# Patient Record
Sex: Female | Born: 1990 | Race: Black or African American | Hispanic: No | Marital: Single | Smoking: Never smoker
Health system: Southern US, Community
[De-identification: ages and names within clinical notes are randomized; demographics above are authoritative.]

## PROBLEM LIST (undated history)

## (undated) ENCOUNTER — Inpatient Hospital Stay (HOSPITAL_COMMUNITY): Payer: Self-pay

## (undated) DIAGNOSIS — K8020 Calculus of gallbladder without cholecystitis without obstruction: Secondary | ICD-10-CM

## (undated) DIAGNOSIS — K219 Gastro-esophageal reflux disease without esophagitis: Secondary | ICD-10-CM

## (undated) HISTORY — PX: NO PAST SURGERIES: SHX2092

---

## 2012-08-19 NOTE — L&D Delivery Note (Signed)
Delivery Note I was called at 2037 and informed she was completely dilated and +2 station, but very numb from her epidural.  I was told she would try pushing and I was to be called if she started to make progress.  The next call I received was at 2125 asking how far away I was and that I should come for delivery.  I immediately left my house and came directly to the hospital.  When I walked in the room the baby was on mom's abdomen with the cord clamped, foley catheter still in place.  I delivered the placenta and inspected the perineum.  At 9:37 PM a viable female was delivered via Vaginal, Spontaneous Delivery.  APGAR: 8, 9; weight pending.   Placenta status: Intact, Spontaneous.  Cord: 3 vessels with the following complications: None.  Anesthesia: Epidural  Episiotomy: None Lacerations: None Suture Repair: None Est. Blood Loss (mL): 400  Mom to postpartum.  Baby to Couplet care / Skin to Skin, stable.  Danel Requena D 07/04/2013, 9:50 PM

## 2013-01-01 LAB — OB RESULTS CONSOLE ANTIBODY SCREEN: Antibody Screen: NEGATIVE

## 2013-01-01 LAB — OB RESULTS CONSOLE RUBELLA ANTIBODY, IGM: Rubella: IMMUNE

## 2013-01-01 LAB — OB RESULTS CONSOLE GC/CHLAMYDIA
Chlamydia: NEGATIVE
Gonorrhea: NEGATIVE

## 2013-01-01 LAB — OB RESULTS CONSOLE ABO/RH

## 2013-01-01 LAB — OB RESULTS CONSOLE HEPATITIS B SURFACE ANTIGEN: Hepatitis B Surface Ag: NEGATIVE

## 2013-03-26 ENCOUNTER — Emergency Department (HOSPITAL_COMMUNITY)
Admission: EM | Admit: 2013-03-26 | Discharge: 2013-03-27 | Disposition: A | Discharge status: Home / Self Care | Payer: Medicaid Other | Attending: Emergency Medicine | Admitting: Emergency Medicine

## 2013-03-26 ENCOUNTER — Emergency Department (HOSPITAL_COMMUNITY): Payer: Medicaid Other

## 2013-03-26 ENCOUNTER — Other Ambulatory Visit: Payer: Self-pay

## 2013-03-26 ENCOUNTER — Encounter (HOSPITAL_COMMUNITY): Payer: Self-pay

## 2013-03-26 DIAGNOSIS — K8050 Calculus of bile duct without cholangitis or cholecystitis without obstruction: Secondary | ICD-10-CM

## 2013-03-26 DIAGNOSIS — O219 Vomiting of pregnancy, unspecified: Secondary | ICD-10-CM | POA: Insufficient documentation

## 2013-03-26 DIAGNOSIS — R112 Nausea with vomiting, unspecified: Secondary | ICD-10-CM

## 2013-03-26 DIAGNOSIS — Z3490 Encounter for supervision of normal pregnancy, unspecified, unspecified trimester: Secondary | ICD-10-CM

## 2013-03-26 DIAGNOSIS — K8020 Calculus of gallbladder without cholecystitis without obstruction: Secondary | ICD-10-CM | POA: Insufficient documentation

## 2013-03-26 DIAGNOSIS — R61 Generalized hyperhidrosis: Secondary | ICD-10-CM | POA: Insufficient documentation

## 2013-03-26 DIAGNOSIS — O9989 Other specified diseases and conditions complicating pregnancy, childbirth and the puerperium: Secondary | ICD-10-CM | POA: Insufficient documentation

## 2013-03-26 DIAGNOSIS — R109 Unspecified abdominal pain: Secondary | ICD-10-CM | POA: Insufficient documentation

## 2013-03-26 LAB — COMPREHENSIVE METABOLIC PANEL
ALT: 25 U/L (ref 0–35)
CO2: 20 mEq/L (ref 19–32)
Calcium: 9.4 mg/dL (ref 8.4–10.5)
Chloride: 101 mEq/L (ref 96–112)
Creatinine, Ser: 0.69 mg/dL (ref 0.50–1.10)
GFR calc Af Amer: >90 mL/min (ref >90)
GFR calc non Af Amer: >90 mL/min (ref >90)
Glucose, Bld: 121 mg/dL — ABNORMAL HIGH (ref 70–99)
Total Bilirubin: 0.2 mg/dL — ABNORMAL LOW (ref 0.3–1.2)

## 2013-03-26 LAB — CBC WITH DIFFERENTIAL/PLATELET
Eosinophils Relative: 1 % (ref 0–5)
HCT: 38.6 % (ref 36.0–46.0)
Hemoglobin: 13.6 g/dL (ref 12.0–15.0)
Lymphocytes Relative: 34 % (ref 12–46)
Lymphs Abs: 4.7 10*3/uL — ABNORMAL HIGH (ref 0.7–4.0)
MCV: 92.8 fL (ref 78.0–100.0)
Monocytes Absolute: 1.3 10*3/uL — ABNORMAL HIGH (ref 0.1–1.0)
Neutro Abs: 7.7 10*3/uL (ref 1.7–7.7)
RBC: 4.16 MIL/uL (ref 3.87–5.11)
WBC: 13.7 10*3/uL — ABNORMAL HIGH (ref 4.0–10.5)

## 2013-03-26 LAB — GLUCOSE, CAPILLARY: Glucose-Capillary: 113 mg/dL — ABNORMAL HIGH (ref 70–99)

## 2013-03-26 MED ORDER — ACETAMINOPHEN 325 MG PO TABS: 650.0000 mg | ORAL_TABLET | Freq: Once | ORAL | Status: DC

## 2013-03-26 MED ORDER — ONDANSETRON HCL 4 MG/2ML IJ SOLN
4.0000 mg | Freq: Once | INTRAMUSCULAR | Status: AC
  Administered 2013-03-26: 4 mg via INTRAVENOUS
  Filled 2013-03-26: qty 2

## 2013-03-26 NOTE — ED Notes (Signed)
Pt c/o epigastric pain radiating into her chest, mid to lower back, nausea, vomiting, diaphoresis, and weakness starting approx 30 mins ago. Pt is [redacted] weeks pregnant, pt's OBGYN sent her to the ED for further evaluation of possible gall stones

## 2013-03-26 NOTE — ED Provider Notes (Signed)
CSN: [id number]     Arrival date & time 03/26/13  2208 History     First MD Initiated Contact with Patient 03/26/13 2245     Chief Complaint  Patient presents with  . Chest Pain  . Abdominal Pain   HPI Morgan Walsh is a 22 y.o. female presenting at 21 weeks pregnancy, she's G1 P0, please refer to the ED by her obstetrician. Earlier this evening after eating a bag of chips, she had acute onset of sharp epigastric/left upper quadrant pain, she said it felt "like gas" was not associated with chest pressure, shortness of breath, hemoptysis, fever, chills, diarrhea. She said it did radiate to the back was also associated with some weakness. It was associated with nausea and vomiting.  Also associated with some diaphoresis. Symptoms have completely resolved, she no longer has pain, nausea, vomiting. Mother had a history of gallstones.   History reviewed. No pertinent past medical history. History reviewed. No pertinent past surgical history. History reviewed. No pertinent family history. History  Substance Use Topics  . Smoking status: Never Smoker   . Smokeless tobacco: Not on file  . Alcohol Use: No   OB History   Grav Para Term Preterm Abortions TAB SAB Ect Mult Living   1              Review of Systems At least 10pt or greater review of systems completed and are negative except where specified in the HPI.  Allergies  Review of patient's allergies indicates no known allergies.  Home Medications   Current Outpatient Rx  Name  Route  Sig  Dispense  Refill  . Pediatric Multivit-Minerals-C (FLINTSTONES GUMMIES PO)   Oral   Take 2 tablets by mouth daily.          BP 118/62  Pulse 102  Resp 24  SpO2 100%  LMP 09/26/2012 Physical Exam  Nursing notes reviewed.  Electronic medical record reviewed. VITAL SIGNS:   Filed Vitals:   03/26/13 2220  BP: 118/62  Pulse: 102  Resp: 24  SpO2: 100%   CONSTITUTIONAL: Awake, oriented, appears non-toxic HENT: Atraumatic,  normocephalic, oral mucosa pink and moist, airway patent. Nares patent without drainage. External ears normal. EYES: Conjunctiva clear, EOMI, PERRLA NECK: Trachea midline, non-tender, supple CARDIOVASCULAR: Normal heart rate, Normal rhythm, No murmurs, rubs, gallops PULMONARY/CHEST: Clear to auscultation, no rhonchi, wheezes, or rales. Symmetrical breath sounds. Non-tender. ABDOMINAL: Non-distended, obese, gravid, soft, non-tender - no rebound or guarding.  BS normal. NEUROLOGIC: Non-focal, moving all four extremities, no gross sensory or motor deficits. EXTREMITIES: No clubbing, cyanosis, or edema SKIN: Warm, Dry, No erythema, No rash  ED Course   Procedures (including critical care time)  Labs Reviewed  CBC WITH DIFFERENTIAL - Abnormal; Notable for the following:    WBC 13.7 (*)    Lymphs Abs 4.7 (*)    Monocytes Absolute 1.3 (*)    All other components within normal limits  COMPREHENSIVE METABOLIC PANEL - Abnormal; Notable for the following:    Glucose, Bld 121 (*)    Albumin 3.2 (*)    Total Bilirubin 0.2 (*)    All other components within normal limits  GLUCOSE, CAPILLARY - Abnormal; Notable for the following:    Glucose-Capillary 113 (*)    All other components within normal limits  LIPASE, BLOOD  POCT I-STAT TROPONIN I   Dg Chest 1 View  03/27/2013   *RADIOLOGY REPORT*  Clinical Data: Epigastric pain radiating to the back.  6 months pregnant.  CHEST - 1 VIEW  Comparison: None.  Findings: Single AP view was obtained.  The patient was shielded. The heart size and mediastinal contours are normal.  Mild vascular congestion is attributed to gravid state.  There is no edema, confluent airspace opacity or significant pleural effusion.  IMPRESSION: No active cardiopulmonary process.   Original Report Authenticated By: Carey Bullocks, M.D.   US Abdomen Complete  03/27/2013   *RADIOLOGY REPORT*  Clinical Data:  Upper abdominal pain.  ABDOMINAL ULTRASOUND COMPLETE  Comparison:  None   Findings:  Gallbladder:  Small stones are seen partially filling the gallbladder, with associated echogenic sludge.  No gallbladder wall thickening or pericholecystic fluid is seen to suggest cholecystitis.  No ultrasonographic Murphy's sign is elicited.  Common Bile Duct:  0.5 cm in diameter; within normal limits in caliber.  Liver:  Normal parenchymal echogenicity and echotexture; no focal lesions identified.  Limited Doppler evaluation demonstrates normal blood flow within the liver.  IVC:  Unremarkable in appearance.  Pancreas:  Although the pancreas is difficult to visualize in its entirety due to overlying bowel gas, no focal pancreatic abnormality is identified.  Spleen:  9.3 cm in length; within normal limits in size and echotexture.  Right kidney:  14.2 cm in length; enlarged, though normal in configuration and parenchymal echogenicity.  No evidence of mass or hydronephrosis.  Left kidney:  11.6 cm in length; normal in size, configuration and parenchymal echogenicity.  No evidence of mass or hydronephrosis.  Abdominal Aorta:  Normal in caliber; no aneurysm identified.  IMPRESSION:  1.  Cholelithiasis noted, with associated echogenic sludge.  No evidence for obstruction or cholecystitis; gallbladder otherwise unremarkable in appearance. 2.  Right-sided nephromegaly incidentally noted; kidneys otherwise unremarkable in appearance.   Original Report Authenticated By: Tonia Ghent, M.D.   1. Cholelithiases   2. Biliary colic   3. Nausea and vomiting   4. Pregnancy    Medications  ondansetron (ZOFRAN) injection 4 mg (4 mg Intravenous Given 03/26/13 2254)     MDM  At bedside ultrasound shows patient has gallstones, fetus heart rate in the 140s - good movement. Bilateral does not extend, do not see any pericholecystic fluid, did not get a good look at the CBD, we'll send patient for a formal study.  Also of patient's pain radiating to her back, chest x-ray however shows no increase in the mediastinal  width. A low suspicion for dissection in this patient.  Ultrasound of the abdomen shows cholelithiasis with echogenic sludge without evidence for obstruction or cholecystitis. This is consistent with labs,  there is no biliary obstruction evidence with LFTs.   Lipase is unremarkable. White blood cell count is slightly elevated at 13.7 this is likely secondary to vomiting, it is not that is not reflective of an acute intra-abdominal process.   The patient followup with her OB/GYN, discussed a bland diet with the patient eating smaller more frequent meals avoiding fatty foods. Patient was sent home with Zofran for nausea.   Jones Skene, MD 03/27/13 0700

## 2013-03-27 ENCOUNTER — Encounter (HOSPITAL_COMMUNITY): Payer: Self-pay

## 2013-03-27 ENCOUNTER — Inpatient Hospital Stay (HOSPITAL_COMMUNITY)
Admission: AD | Admit: 2013-03-27 | Discharge: 2013-03-27 | Disposition: A | Discharge status: Home / Self Care | Payer: Medicaid Other | Source: Ambulatory Visit | Attending: Obstetrics and Gynecology | Admitting: Obstetrics and Gynecology

## 2013-03-27 DIAGNOSIS — R1011 Right upper quadrant pain: Secondary | ICD-10-CM | POA: Insufficient documentation

## 2013-03-27 DIAGNOSIS — K8020 Calculus of gallbladder without cholecystitis without obstruction: Secondary | ICD-10-CM

## 2013-03-27 DIAGNOSIS — O9989 Other specified diseases and conditions complicating pregnancy, childbirth and the puerperium: Secondary | ICD-10-CM | POA: Insufficient documentation

## 2013-03-27 LAB — URINE MICROSCOPIC-ADD ON

## 2013-03-27 LAB — URINALYSIS, ROUTINE W REFLEX MICROSCOPIC
Glucose, UA: 100 mg/dL — AB
Ketones, ur: NEGATIVE mg/dL
Nitrite: NEGATIVE
Specific Gravity, Urine: 1.02 (ref 1.005–1.030)
pH: 7.5 (ref 5.0–8.0)

## 2013-03-27 MED ORDER — ONDANSETRON HCL 4 MG PO TABS: 4.0000 mg | ORAL_TABLET | Freq: Four times a day (QID) | ORAL | Status: DC

## 2013-03-27 MED ORDER — OXYCODONE-ACETAMINOPHEN 5-325 MG PO TABS: 1.0000 | ORAL_TABLET | ORAL | Status: DC | PRN

## 2013-03-27 NOTE — MAU Provider Note (Signed)
History     CSN: [id number]  Arrival date and time: 03/27/13 4098  Seen by provider at 2015    Chief Complaint  Patient presents with  . Cholelithiasis  . Abdominal Pain   HPI Morgan Walsh 21 y.o. [redacted]w[redacted]d Was at [facility] ER yesterday and was diagnosed with gallstones in pregnancy.  Today began having right upper abdominal pain and left flank pain so she returned to MAU.  SHe had a severe episode of pain with nausea and diaphoresis which lasted about 15 minutes.  By the time she came to MAU and was assisted to a stretcher, the pain was resolving.  Wants some pain medication to have at home.  OB History   Grav Para Term Preterm Abortions TAB SAB Ect Mult Living   1               Past Medical History  Diagnosis Date  . Medical history non-contributory     Past Surgical History  Procedure Laterality Date  . No past surgeries      History reviewed. No pertinent family history.  History  Substance Use Topics  . Smoking status: Never Smoker   . Smokeless tobacco: Never Used  . Alcohol Use: No    Allergies: No Known Allergies  Prescriptions prior to admission  Medication Sig Dispense Refill  . ondansetron (ZOFRAN) 4 MG tablet Take 1 tablet (4 mg total) by mouth every 6 (six) hours.  12 tablet  0  . Pediatric Multivit-Minerals-C (FLINTSTONES GUMMIES PO) Take 2 tablets by mouth daily.        Review of Systems  Constitutional: Negative for fever.  Gastrointestinal: Positive for nausea and abdominal pain. Negative for vomiting, diarrhea and constipation.  Genitourinary:       No vaginal discharge. No vaginal bleeding. No dysuria.   Physical Exam   Blood pressure 111/57, pulse 71, temperature 98.3 F (36.8 C), temperature source Axillary, resp. rate 16, height 5\' 7"  (1.702 m), weight 230 lb (104.327 kg), last menstrual period 09/26/2012, SpO2 100.00%.  Physical Exam  Nursing note and vitals reviewed. Constitutional: She is oriented to person, place, and time. She  appears well-developed and well-nourished. No distress.  HENT:  Head: Normocephalic.  Eyes: EOM are normal.  Neck: Neck supple.  Musculoskeletal: Normal range of motion.  Neurological: She is alert and oriented to person, place, and time.  Skin: Skin is warm and dry.  Psychiatric: She has a normal mood and affect.    MAU Course  Procedures Results for orders placed during the hospital encounter of 03/27/13 (from the past 24 hour(s))  URINALYSIS, ROUTINE W REFLEX MICROSCOPIC     Status: Abnormal   Collection Time    03/27/13  7:56 PM      Result Value Range   Color, Urine YELLOW  YELLOW   APPearance HAZY (*) CLEAR   Specific Gravity, Urine 1.020  1.005 - 1.030   pH 7.5  5.0 - 8.0   Glucose, UA 100 (*) NEGATIVE mg/dL   Hgb urine dipstick NEGATIVE  NEGATIVE   Bilirubin Urine NEGATIVE  NEGATIVE   Ketones, ur NEGATIVE  NEGATIVE mg/dL   Protein, ur NEGATIVE  NEGATIVE mg/dL   Urobilinogen, UA 1.0  0.0 - 1.0 mg/dL   Nitrite NEGATIVE  NEGATIVE   Leukocytes, UA TRACE (*) NEGATIVE  URINE MICROSCOPIC-ADD ON     Status: Abnormal   Collection Time    03/27/13  7:56 PM      Result Value Range  Squamous Epithelial / LPF FEW (*) RARE   WBC, UA 0-2  <3 WBC/hpf   Bacteria, UA RARE  RARE   Urine-Other MUCOUS PRESENT      MDM Consult with Dr. Senaida Ores re: plan of care  Assessment and Plan  Gall bladder attack Pregnancy [redacted]w[redacted]d  Plan Rx percocet one po q 6 hours for pain. (#8) no refills Follow up in the office this week Reviewed dietary choices to reduce occurrence of pain. Reduce intake of sugar and carbohydrates.  [name] 03/27/2013, 7:54 PM

## 2013-03-27 NOTE — MAU Note (Signed)
Pt states was seen at [facility], dx'd with gallstones, given iv fluids w phenergan, not given meds for pain, was told to come to MAU if pain returned. Pt states when pain began, became nauseated and clammy.

## 2013-04-01 ENCOUNTER — Encounter (INDEPENDENT_AMBULATORY_CARE_PROVIDER_SITE_OTHER): Payer: Self-pay | Admitting: Surgery

## 2013-04-07 ENCOUNTER — Ambulatory Visit (INDEPENDENT_AMBULATORY_CARE_PROVIDER_SITE_OTHER): Payer: Medicaid Other | Admitting: General Surgery

## 2013-04-12 ENCOUNTER — Ambulatory Visit (INDEPENDENT_AMBULATORY_CARE_PROVIDER_SITE_OTHER): Payer: Medicaid Other | Admitting: Surgery

## 2013-04-12 ENCOUNTER — Encounter (INDEPENDENT_AMBULATORY_CARE_PROVIDER_SITE_OTHER): Payer: Self-pay | Admitting: Surgery

## 2013-04-12 VITALS — BP 118/68 | HR 64 | Temp 97.9°F | Resp 14 | Ht 67.0 in | Wt 226.0 lb

## 2013-04-12 DIAGNOSIS — K8020 Calculus of gallbladder without cholecystitis without obstruction: Secondary | ICD-10-CM

## 2013-04-12 NOTE — Progress Notes (Signed)
Patient ID: Morgan Walsh, female   DOB: 08/21/1990, 22 y.o.   MRN: [id number]  Chief Complaint  Patient presents with  . New Evaluation    eval gb    HPI Morgan Walsh is a 22 y.o. female.   HPI This is a very pleasant female referred by Dr. Ambrose Mantle for evaluation of symptomatic cholelithiasis. She is [redacted] weeks pregnant. She had one episode of right upper quadrant abdominal pain and nausea and vomiting. She had to present to [facility]. She quickly improved and is now symptom free. This had occurred after a fatty meal. The pain was moderate in intensity and did not refer any where else. Again, she has had no further attacks of discomfort. Past Medical History  Diagnosis Date  . Medical history non-contributory     Past Surgical History  Procedure Laterality Date  . No past surgeries      History reviewed. No pertinent family history.  Social History History  Substance Use Topics  . Smoking status: Never Smoker   . Smokeless tobacco: Never Used  . Alcohol Use: No    No Known Allergies  No current outpatient prescriptions on file.   No current facility-administered medications for this visit.    Review of Systems Review of Systems  Constitutional: Negative for fever, chills and unexpected weight change.  HENT: Negative for hearing loss, congestion, sore throat, trouble swallowing and voice change.   Eyes: Negative for visual disturbance.  Respiratory: Negative for cough and wheezing.   Cardiovascular: Negative for chest pain, palpitations and leg swelling.  Gastrointestinal: Positive for nausea and abdominal pain. Negative for vomiting, diarrhea, constipation, blood in stool, abdominal distention and anal bleeding.  Genitourinary: Negative for hematuria, vaginal bleeding and difficulty urinating.  Musculoskeletal: Negative for arthralgias.  Skin: Negative for rash and wound.  Neurological: Negative for seizures, syncope and headaches.  Hematological: Negative  for adenopathy. Does not bruise/bleed easily.  Psychiatric/Behavioral: Negative for confusion.    Blood pressure 118/68, pulse 64, temperature 97.9 F (36.6 C), temperature source Temporal, resp. rate 14, height 5\' 7"  (1.702 m), weight 226 lb (102.513 kg), last menstrual period 09/26/2012.  Physical Exam Physical Exam  Constitutional: She is oriented to person, place, and time. She appears well-developed and well-nourished. No distress.  HENT:  Head: Normocephalic and atraumatic.  Right Ear: External ear normal.  Left Ear: External ear normal.  Nose: Nose normal.  Mouth/Throat: No oropharyngeal exudate.  Eyes: Conjunctivae are normal. Pupils are equal, round, and reactive to light. Right eye exhibits no discharge. Left eye exhibits no discharge. No scleral icterus.  Neck: Normal range of motion. Neck supple. No tracheal deviation present.  Cardiovascular: Normal rate, regular rhythm, normal heart sounds and intact distal pulses.   No murmur heard. Pulmonary/Chest: Effort normal and breath sounds normal. No respiratory distress. She has no wheezes.  Abdominal: Soft. Bowel sounds are normal.  Gravid abdomen  There is no tenderness or guarding in the right upper quadrant or anywhere in the rest of the abdomen  Musculoskeletal: Normal range of motion. She exhibits no edema and no tenderness.  Lymphadenopathy:    She has no cervical adenopathy.  Neurological: She is alert and oriented to person, place, and time. No cranial nerve deficit.  Skin: Skin is warm and dry. No rash noted. No erythema.  Psychiatric: Her behavior is normal. Judgment normal.    Data Reviewed I have reviewed her ultrasound showing cholelithiasis. There is no gallbladder wall thickening in the bile duct is  normal. Liver function tests are normal  Assessment    Symptomatic cholelithiasis in pregnancy     Plan    Because she is now symptom free, I would like to hold a laparoscopic cholecystectomy control after  her delivery. She is currently in the second trimester. I explained gallbladder disease to her in detail and gave her literature regarding the surgery. I discussed laps have a cholecystectomy with her in detail as well. She will call me back after her delivery and less she has another attack. If she has another attack, we will consider laparoscopic cholecystectomy during the pregnancy.        Chia Rock A 04/12/2013, 3:35 PM

## 2013-05-11 ENCOUNTER — Encounter (HOSPITAL_COMMUNITY): Payer: Self-pay | Admitting: *Deleted

## 2013-05-11 ENCOUNTER — Inpatient Hospital Stay (HOSPITAL_COMMUNITY)
Admission: AD | Admit: 2013-05-11 | Discharge: 2013-05-11 | Disposition: A | Discharge status: Home / Self Care | Payer: Medicaid Other | Source: Ambulatory Visit | Attending: Obstetrics and Gynecology | Admitting: Obstetrics and Gynecology

## 2013-05-11 DIAGNOSIS — O99891 Other specified diseases and conditions complicating pregnancy: Secondary | ICD-10-CM

## 2013-05-11 DIAGNOSIS — M549 Dorsalgia, unspecified: Secondary | ICD-10-CM

## 2013-05-11 DIAGNOSIS — Y92410 Unspecified street and highway as the place of occurrence of the external cause: Secondary | ICD-10-CM | POA: Insufficient documentation

## 2013-05-11 LAB — URINALYSIS, ROUTINE W REFLEX MICROSCOPIC
Bilirubin Urine: NEGATIVE
Hgb urine dipstick: NEGATIVE
Ketones, ur: 15 mg/dL — AB
Nitrite: NEGATIVE
Protein, ur: NEGATIVE mg/dL
Specific Gravity, Urine: >1.03 — ABNORMAL HIGH (ref 1.005–1.030)
Urobilinogen, UA: 0.2 mg/dL (ref 0.0–1.0)
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
## 2013-05-12 LAB — URINE CULTURE: Colony Count: 50000

## 2013-05-25 ENCOUNTER — Inpatient Hospital Stay (HOSPITAL_COMMUNITY)
Admission: AD | Admit: 2013-05-25 | Discharge: 2013-05-25 | Disposition: A | Discharge status: Home / Self Care | Payer: Medicaid Other | Source: Ambulatory Visit | Attending: Obstetrics and Gynecology | Admitting: Obstetrics and Gynecology

## 2013-05-25 ENCOUNTER — Encounter (HOSPITAL_COMMUNITY): Payer: Self-pay | Admitting: *Deleted

## 2013-05-25 DIAGNOSIS — O99891 Other specified diseases and conditions complicating pregnancy: Secondary | ICD-10-CM | POA: Insufficient documentation

## 2013-05-25 DIAGNOSIS — R059 Cough, unspecified: Secondary | ICD-10-CM | POA: Insufficient documentation

## 2013-05-25 DIAGNOSIS — A088 Other specified intestinal infections: Secondary | ICD-10-CM | POA: Insufficient documentation

## 2013-05-25 DIAGNOSIS — A084 Viral intestinal infection, unspecified: Secondary | ICD-10-CM

## 2013-05-25 DIAGNOSIS — R05 Cough: Secondary | ICD-10-CM | POA: Insufficient documentation

## 2013-05-25 DIAGNOSIS — J029 Acute pharyngitis, unspecified: Secondary | ICD-10-CM | POA: Insufficient documentation

## 2013-05-25 LAB — URINALYSIS, ROUTINE W REFLEX MICROSCOPIC
Urobilinogen, UA: 0.2 mg/dL (ref 0.0–1.0)
pH: 6 (ref 5.0–8.0)

## 2013-05-25 LAB — URINE MICROSCOPIC-ADD ON

## 2013-05-25 MED ORDER — PROMETHAZINE HCL 12.5 MG PO TABS: 12.5000 mg | ORAL_TABLET | Freq: Four times a day (QID) | ORAL | Status: DC | PRN

## 2013-05-25 NOTE — MAU Note (Signed)
Patient is in with coughing, sore throat and nausea. Patient states that she have not vomited since Saturday. Denies abdominal pain, vaginal bleeding or lof. She reports good fetal movement. Patient states that she called her doctor;s office for anti-emetic and didn't get a call back

## 2013-05-25 NOTE — MAU Note (Signed)
Patient presents with complaint of sore throat x 1 week; cough and n/v x 4 days; states that she has not thrown up since Sunday morning (2 days ago).

## 2013-05-25 NOTE — MAU Provider Note (Signed)
Chief Complaint:  Nausea, Sore Throat and Cough   First Provider Initiated Contact with Patient 05/25/13 1335      HPI: Morgan VIOLETTE is a 22 y.o. G1P0 at [redacted]w[redacted]d who presents to maternity admissions reporting one week history of assistant nausea and occasional vomiting along with scratchy throat and ingestion with cough only in the mornings. Denies fever or chills, chest pain, shortness of breath. Minimal malaise; attending [employer]. She would like prescription for an antiemetic. Denies contractions, leakage of fluid or vaginal bleeding. Good fetal movement.   Pregnancy Course: essentially uncomplicated  Past Medical History: Past Medical History  Diagnosis Date  . Medical history non-contributory     Past obstetric history: OB History  Gravida Para Term Preterm AB SAB TAB Ectopic Multiple Living  1             # Outcome Date GA Lbr Len/2nd Weight Sex Delivery Anes PTL Lv  1 CUR               Past Surgical History: Past Surgical History  Procedure Laterality Date  . No past surgeries       Family History: Family History  Problem Relation Age of Onset  . Hypertension Mother   . Stroke Maternal Grandmother   . Diabetes Maternal Grandmother     Social History: History  Substance Use Topics  . Smoking status: Never Smoker   . Smokeless tobacco: Never Used  . Alcohol Use: No    Allergies: No Known Allergies  Meds:  Prescriptions prior to admission  Medication Sig Dispense Refill  . oxyCODONE-acetaminophen (PERCOCET/ROXICET) 5-325 MG per tablet Take 1 tablet by mouth every 4 (four) hours as needed for pain (back pain).        ROS: Pertinent findings in history of present illness.  Physical Exam  Blood pressure 129/78, pulse 81, temperature 98.5 F (36.9 C), temperature source Oral, resp. rate 18, height 5' 4.5" (1.638 m), weight 100.018 kg (220 lb 8 oz), last menstrual period 09/26/2012, SpO2 100.00%. GENERAL: Well-developed, well-nourished female in no acute  distress.  HEENT: normocephalic. Throat clear. Tonsils not enlarged, pink, no exudate. Nasal mucosa normal. No neck lymphadenopathy HEART: normal rate RESP: normal effort. Clear to auscultation bilaterally ABDOMEN: Soft, non-tender, gravid appropriate for gestational age EXTREMITIES: Nontender, no edema NEURO: alert and oriented FHT:  Baseline 135-140 , moderate variability, accelerations present, no decelerations Contractions: none   Labs: Results for orders placed during the hospital encounter of 05/25/13 (from the past 24 hour(s))  URINALYSIS, ROUTINE W REFLEX MICROSCOPIC     Status: Abnormal   Collection Time    05/25/13 12:40 PM      Result Value Range   Color, Urine YELLOW  YELLOW   APPearance CLOUDY (*) CLEAR   Specific Gravity, Urine 1.025  1.005 - 1.030   pH 6.0  5.0 - 8.0   Glucose, UA NEGATIVE  NEGATIVE mg/dL   Hgb urine dipstick NEGATIVE  NEGATIVE   Bilirubin Urine SMALL (*) NEGATIVE   Ketones, ur 15 (*) NEGATIVE mg/dL   Protein, ur NEGATIVE  NEGATIVE mg/dL   Urobilinogen, UA 0.2  0.0 - 1.0 mg/dL   Nitrite NEGATIVE  NEGATIVE   Leukocytes, UA MODERATE (*) NEGATIVE  URINE MICROSCOPIC-ADD ON     Status: Abnormal   Collection Time    05/25/13 12:40 PM      Result Value Range   Squamous Epithelial / LPF MANY (*) RARE   WBC, UA 7-10  <3 WBC/hpf  Bacteria, UA MANY (*) RARE   Urine-Other MUCOUS PRESENT      Imaging:  No results found.  MAU Course: Tolerating ginger ale  Assessment: 1. Viral gastroenteritis   G1 at 32w  Plan: Discussed with Dr. Jackelyn Knife. Discharge home BRAT diet, increase fluids, rest.     Medication List    STOP taking these medications       oxyCODONE-acetaminophen 5-325 MG per tablet  Commonly known as:  PERCOCET/ROXICET      TAKE these medications       promethazine 12.5 MG tablet  Commonly known as:  PHENERGAN  Take 1 tablet (12.5 mg total) by mouth every 6 (six) hours as needed for nausea.       Follow-up Information    Follow up In 1 week. (keep your office appointment)      Danae Orleans, CNM 05/25/2013 1:39 PM

## 2013-05-26 LAB — URINE CULTURE

## 2013-05-26 NOTE — Progress Notes (Signed)
FHT from 10-7 reviewed.  Reactive NST, no significant decels or ctx.

## 2013-05-30 ENCOUNTER — Inpatient Hospital Stay (HOSPITAL_COMMUNITY)
Admission: AD | Admit: 2013-05-30 | Discharge: 2013-05-30 | Disposition: A | Discharge status: Home / Self Care | Payer: Medicaid Other | Source: Ambulatory Visit | Attending: Obstetrics and Gynecology | Admitting: Obstetrics and Gynecology

## 2013-05-30 ENCOUNTER — Encounter (HOSPITAL_COMMUNITY): Payer: Self-pay | Admitting: *Deleted

## 2013-05-30 DIAGNOSIS — O9989 Other specified diseases and conditions complicating pregnancy, childbirth and the puerperium: Secondary | ICD-10-CM | POA: Insufficient documentation

## 2013-05-30 DIAGNOSIS — K8020 Calculus of gallbladder without cholecystitis without obstruction: Secondary | ICD-10-CM

## 2013-05-30 DIAGNOSIS — O26619 Liver and biliary tract disorders in pregnancy, unspecified trimester: Secondary | ICD-10-CM

## 2013-05-30 DIAGNOSIS — R1011 Right upper quadrant pain: Secondary | ICD-10-CM | POA: Insufficient documentation

## 2013-05-30 LAB — AMYLASE: Amylase: 179 U/L — ABNORMAL HIGH (ref 0–105)

## 2013-05-30 LAB — LIPASE, BLOOD: Lipase: 21 U/L (ref 11–59)

## 2013-05-30 LAB — COMPREHENSIVE METABOLIC PANEL
ALT: 12 U/L (ref 0–35)
Alkaline Phosphatase: 136 U/L — ABNORMAL HIGH (ref 39–117)
CO2: 19 mEq/L (ref 19–32)
Calcium: 9.1 mg/dL (ref 8.4–10.5)
Chloride: 99 mEq/L (ref 96–112)
GFR calc Af Amer: >90 mL/min (ref >90)
GFR calc non Af Amer: >90 mL/min (ref >90)
Glucose, Bld: 117 mg/dL — ABNORMAL HIGH (ref 70–99)
Sodium: 136 mEq/L (ref 135–145)
Total Bilirubin: 0.3 mg/dL (ref 0.3–1.2)

## 2013-05-30 LAB — CBC
Hemoglobin: 12.5 g/dL (ref 12.0–15.0)
MCH: 31.6 pg (ref 26.0–34.0)
RBC: 3.96 MIL/uL (ref 3.87–5.11)

## 2013-05-30 MED ORDER — HYDROMORPHONE HCL PF 1 MG/ML IJ SOLN: 1.0000 mg | Freq: Once | INTRAMUSCULAR | Status: DC

## 2013-05-30 NOTE — MAU Provider Note (Signed)
History     CSN: [id number]  Arrival date and time: 05/30/13 0102   First Provider Initiated Contact with Patient 05/30/13 0132      Chief Complaint  Patient presents with  . Abdominal Pain   HPI  Pt is a G1P0 at [redacted]w[redacted]d weeks IUP here with report of RUQ pain that started at midnight.  Pt was diagnosed with gallstones at 24 wks IUP, but treatment not given since patient was asymptomatic.  Report pain is rated 0 at this moment, however a 10/10 when it comes.  Last occurrence of pain was approximately an hour ago.  Took a percocet to help with the pain.  Also experiencing nausea and vomiting, 3-4 episodes tonight.  +diarrhea that also started yesterday.  Uncertain if had a fever at home.    Past Medical History  Diagnosis Date  . Medical history non-contributory   . Gall stone     Past Surgical History  Procedure Laterality Date  . No past surgeries      Family History  Problem Relation Age of Onset  . Hypertension Mother   . Stroke Maternal Grandmother   . Diabetes Maternal Grandmother     History  Substance Use Topics  . Smoking status: Never Smoker   . Smokeless tobacco: Never Used  . Alcohol Use: No    Allergies: No Known Allergies  Prescriptions prior to admission  Medication Sig Dispense Refill  . promethazine (PHENERGAN) 12.5 MG tablet Take 1 tablet (12.5 mg total) by mouth every 6 (six) hours as needed for nausea.  10 tablet  0    Review of Systems  Constitutional: Negative for fever and chills.  Gastrointestinal: Positive for nausea, vomiting, abdominal pain and diarrhea.  Musculoskeletal: Positive for back pain (mid).  All other systems reviewed and are negative.   Physical Exam   Blood pressure 124/67, pulse 93, resp. rate 28, last menstrual period 09/26/2012, SpO2 100.00%.  Physical Exam  Constitutional: She is oriented to person, place, and time. She appears well-developed and well-nourished. She appears distressed (appears uncomfortable).   HENT:  Head: Normocephalic.  Neck: Normal range of motion. Neck supple.  Cardiovascular: Normal rate, regular rhythm and normal heart sounds.   Respiratory: Effort normal and breath sounds normal.  GI: Soft. There is tenderness (RUQ).  Genitourinary: No bleeding around the vagina. Vaginal discharge:    Neurological: She is alert and oriented to person, place, and time.  Skin: Skin is warm. She is diaphoretic.   Dilation: Closed Effacement (%): Thick Cervical Position: Posterior Station: Ballotable Exam by:: Roney Marion, CNM  FHR 120's, +accels, reactive Toco - brief intermittent period of irregular contractions (not felt by patient) with resolution at discharge MAU Course  Procedures Results for orders placed during the hospital encounter of 05/30/13 (from the past 24 hour(s))  CBC     Status: Abnormal   Collection Time    05/30/13  1:42 AM      Result Value Range   WBC 11.5 (*) 4.0 - 10.5 K/uL   RBC 3.96  3.87 - 5.11 MIL/uL   Hemoglobin 12.5  12.0 - 15.0 g/dL   HCT 40.9  81.1 - 91.4 %   MCV 92.9  78.0 - 100.0 fL   MCH 31.6  26.0 - 34.0 pg   MCHC 34.0  30.0 - 36.0 g/dL   RDW 78.2  95.6 - 21.3 %   Platelets 317  150 - 400 K/uL  COMPREHENSIVE METABOLIC PANEL     Status: Abnormal  Collection Time    05/30/13  1:42 AM      Result Value Range   Sodium 136  135 - 145 mEq/L   Potassium 3.7  3.5 - 5.1 mEq/L   Chloride 99  96 - 112 mEq/L   CO2 19  19 - 32 mEq/L   Glucose, Bld 117 (*) 70 - 99 mg/dL   BUN 6  6 - 23 mg/dL   Creatinine, Ser 1.61  0.50 - 1.10 mg/dL   Calcium 9.1  8.4 - 09.6 mg/dL   Total Protein 6.7  6.0 - 8.3 g/dL   Albumin 2.9 (*) 3.5 - 5.2 g/dL   AST 25  0 - 37 U/L   ALT 12  0 - 35 U/L   Alkaline Phosphatase 136 (*) 39 - 117 U/L   Total Bilirubin 0.3  0.3 - 1.2 mg/dL   GFR calc non Af Amer >90  >90 mL/min   GFR calc Af Amer >90  >90 mL/min  AMYLASE     Status: Abnormal   Collection Time    05/30/13  1:42 AM      Result Value Range   Amylase 179 (*) 0  - 105 U/L  LIPASE, BLOOD     Status: None   Collection Time    05/30/13  1:42 AM      Result Value Range   Lipase 21  11 - 59 U/L    0220 Consulted with Dr. Ambrose Mantle > reviewed HPI/exam/labs/OB&Med history > obtain CMP/lipase/amylase and treat pain if needed > dilaudid 1 mg IM ordered (pt reports pain gone when medication brought in room)  0430 Dr. Ambrose Mantle called with lab results > okay to discharge patient home since pain resolved with follow-up in office on Monday to repeat labs.   Assessment and Plan  Cholelithiasis in Pregnancy  Plan: Discharge to home Attention to diet Call for appointment with Dr. Ambrose Mantle on Monday  [facility] 05/30/2013, 1:33 AM

## 2013-05-30 NOTE — MAU Note (Signed)
Patient does not need pain meds at this time as her pain is a 0/10.

## 2013-05-30 NOTE — MAU Note (Addendum)
Pt report she has history of gall stones. Started having pain in her upper abd  This evening tha t has not stopped. Pt very uncomfortable and needing assistance to room and undress. Pt mother reported she took percocet around midnight without relief.

## 2013-07-03 ENCOUNTER — Encounter (HOSPITAL_COMMUNITY): Payer: Self-pay | Admitting: *Deleted

## 2013-07-03 ENCOUNTER — Inpatient Hospital Stay (HOSPITAL_COMMUNITY)
Admission: AD | Admit: 2013-07-03 | Discharge: 2013-07-06 | DRG: 775 | Disposition: A | Discharge status: Home / Self Care | Payer: Medicaid Other | Source: Ambulatory Visit | Attending: Obstetrics and Gynecology | Admitting: Obstetrics and Gynecology

## 2013-07-03 DIAGNOSIS — O26899 Other specified pregnancy related conditions, unspecified trimester: Secondary | ICD-10-CM | POA: Diagnosis present

## 2013-07-03 DIAGNOSIS — O4290 Premature rupture of membranes, unspecified as to length of time between rupture and onset of labor, unspecified weeks of gestation: Secondary | ICD-10-CM | POA: Diagnosis present

## 2013-07-03 DIAGNOSIS — Z22330 Carrier of Group B streptococcus: Secondary | ICD-10-CM

## 2013-07-03 DIAGNOSIS — K8020 Calculus of gallbladder without cholecystitis without obstruction: Secondary | ICD-10-CM | POA: Diagnosis present

## 2013-07-03 DIAGNOSIS — O99892 Other specified diseases and conditions complicating childbirth: Secondary | ICD-10-CM | POA: Diagnosis present

## 2013-07-03 LAB — POCT FERN TEST: POCT Fern Test: POSITIVE

## 2013-07-03 NOTE — Progress Notes (Signed)
Dr Mesinger notified of pt's admisson and status. Aware of SROM with cl fld tonight, some u/i but no pain, hx gall stones. Will admit to [facility]

## 2013-07-03 NOTE — MAU Note (Signed)
Report called to Dana RN in BS.  

## 2013-07-04 ENCOUNTER — Encounter (HOSPITAL_COMMUNITY): Payer: Self-pay | Admitting: *Deleted

## 2013-07-04 ENCOUNTER — Inpatient Hospital Stay (HOSPITAL_COMMUNITY): Payer: Medicaid Other

## 2013-07-04 ENCOUNTER — Encounter (HOSPITAL_COMMUNITY): Payer: Medicaid Other | Admitting: Anesthesiology

## 2013-07-04 ENCOUNTER — Inpatient Hospital Stay (HOSPITAL_COMMUNITY): Payer: Medicaid Other | Admitting: Anesthesiology

## 2013-07-04 DIAGNOSIS — O4290 Premature rupture of membranes, unspecified as to length of time between rupture and onset of labor, unspecified weeks of gestation: Secondary | ICD-10-CM | POA: Diagnosis present

## 2013-07-04 LAB — RPR: RPR Ser Ql: NONREACTIVE

## 2013-07-04 LAB — CBC
HCT: 36.4 % (ref 36.0–46.0)
Hemoglobin: 12.2 g/dL (ref 12.0–15.0)
MCHC: 33.5 g/dL (ref 30.0–36.0)
MCV: 90.8 fL (ref 78.0–100.0)
RDW: 13.3 % (ref 11.5–15.5)
WBC: 12.2 10*3/uL — ABNORMAL HIGH (ref 4.0–10.5)

## 2013-07-04 LAB — TYPE AND SCREEN
ABO/RH(D): A POS
Antibody Screen: NEGATIVE

## 2013-07-04 MED ORDER — CITRIC ACID-SODIUM CITRATE 334-500 MG/5ML PO SOLN: 30.0000 mL | ORAL | Status: DC | PRN

## 2013-07-04 MED ORDER — LIDOCAINE HCL (PF) 1 % IJ SOLN
INTRAMUSCULAR | Status: DC | PRN
  Administered 2013-07-04: 9 mL
  Administered 2013-07-04: 8 mL

## 2013-07-04 MED ORDER — PHENYLEPHRINE 40 MCG/ML (10ML) SYRINGE FOR IV PUSH (FOR BLOOD PRESSURE SUPPORT)
80.0000 ug | PREFILLED_SYRINGE | INTRAVENOUS | Status: DC | PRN
  Filled 2013-07-04: qty 10
  Filled 2013-07-04: qty 2

## 2013-07-04 MED ORDER — LACTATED RINGERS IV SOLN
INTRAVENOUS | Status: DC
  Administered 2013-07-04 (×4): via INTRAVENOUS

## 2013-07-04 MED ORDER — OXYTOCIN 40 UNITS IN LACTATED RINGERS INFUSION - SIMPLE MED
1.0000 m[IU]/min | INTRAVENOUS | Status: DC
  Administered 2013-07-04: 2 m[IU]/min via INTRAVENOUS

## 2013-07-04 MED ORDER — ONDANSETRON HCL 4 MG/2ML IJ SOLN
4.0000 mg | Freq: Four times a day (QID) | INTRAMUSCULAR | Status: DC | PRN
  Administered 2013-07-04: 4 mg via INTRAVENOUS
  Filled 2013-07-04: qty 2

## 2013-07-04 MED ORDER — LACTATED RINGERS IV SOLN
500.0000 mL | INTRAVENOUS | Status: DC | PRN
  Administered 2013-07-04: 1000 mL via INTRAVENOUS

## 2013-07-04 MED ORDER — IBUPROFEN 600 MG PO TABS: 600.0000 mg | ORAL_TABLET | Freq: Four times a day (QID) | ORAL | Status: DC | PRN

## 2013-07-04 MED ORDER — EPHEDRINE 5 MG/ML INJ
10.0000 mg | INTRAVENOUS | Status: DC | PRN
  Filled 2013-07-04: qty 2

## 2013-07-04 MED ORDER — DIPHENHYDRAMINE HCL 50 MG/ML IJ SOLN: 12.5000 mg | INTRAMUSCULAR | Status: DC | PRN

## 2013-07-04 MED ORDER — LIDOCAINE HCL (PF) 1 % IJ SOLN
30.0000 mL | INTRAMUSCULAR | Status: DC | PRN
  Filled 2013-07-04 (×2): qty 30

## 2013-07-04 MED ORDER — ACETAMINOPHEN 325 MG PO TABS: 650.0000 mg | ORAL_TABLET | ORAL | Status: DC | PRN

## 2013-07-04 MED ORDER — PENICILLIN G POTASSIUM 5000000 UNITS IJ SOLR
5.0000 10*6.[IU] | Freq: Once | INTRAVENOUS | Status: AC
  Administered 2013-07-04: 5 10*6.[IU] via INTRAVENOUS
  Filled 2013-07-04: qty 5

## 2013-07-04 MED ORDER — PENICILLIN G POTASSIUM 5000000 UNITS IJ SOLR
2.5000 10*6.[IU] | INTRAVENOUS | Status: DC
  Administered 2013-07-04 (×5): 2.5 10*6.[IU] via INTRAVENOUS
  Filled 2013-07-04 (×10): qty 2.5

## 2013-07-04 MED ORDER — BUTORPHANOL TARTRATE 1 MG/ML IJ SOLN: 1.0000 mg | INTRAMUSCULAR | Status: DC | PRN

## 2013-07-04 MED ORDER — FENTANYL 2.5 MCG/ML BUPIVACAINE 1/10 % EPIDURAL INFUSION (WH - ANES)
14.0000 mL/h | INTRAMUSCULAR | Status: DC | PRN
  Administered 2013-07-04 (×2): 14 mL/h via EPIDURAL
  Filled 2013-07-04 (×3): qty 125

## 2013-07-04 MED ORDER — TERBUTALINE SULFATE 1 MG/ML IJ SOLN: 0.2500 mg | Freq: Once | INTRAMUSCULAR | Status: AC | PRN

## 2013-07-04 MED ORDER — PHENYLEPHRINE 40 MCG/ML (10ML) SYRINGE FOR IV PUSH (FOR BLOOD PRESSURE SUPPORT)
80.0000 ug | PREFILLED_SYRINGE | INTRAVENOUS | Status: DC | PRN
  Filled 2013-07-04: qty 2

## 2013-07-04 MED ORDER — OXYCODONE-ACETAMINOPHEN 5-325 MG PO TABS: 1.0000 | ORAL_TABLET | ORAL | Status: DC | PRN

## 2013-07-04 MED ORDER — OXYTOCIN 40 UNITS IN LACTATED RINGERS INFUSION - SIMPLE MED
62.5000 mL/h | INTRAVENOUS | Status: DC
  Filled 2013-07-04: qty 1000

## 2013-07-04 MED ORDER — LACTATED RINGERS IV SOLN: 500.0000 mL | Freq: Once | INTRAVENOUS | Status: DC

## 2013-07-04 MED ORDER — EPHEDRINE 5 MG/ML INJ
10.0000 mg | INTRAVENOUS | Status: DC | PRN
  Filled 2013-07-04: qty 2
  Filled 2013-07-04: qty 4

## 2013-07-04 MED ORDER — OXYTOCIN BOLUS FROM INFUSION
500.0000 mL | INTRAVENOUS | Status: DC
  Administered 2013-07-04: 500 mL via INTRAVENOUS

## 2013-07-04 MED ORDER — FENTANYL 2.5 MCG/ML BUPIVACAINE 1/10 % EPIDURAL INFUSION (WH - ANES)
INTRAMUSCULAR | Status: DC | PRN
  Administered 2013-07-04: 14 mL/h via EPIDURAL

## 2013-07-04 NOTE — Anesthesia Procedure Notes (Signed)
Epidural Patient location during procedure: OB Start time: 07/04/2013 4:34 AM End time: 07/04/2013 4:38 AM  Staffing Anesthesiologist: Leilani Able Performed by: anesthesiologist   Preanesthetic Checklist Completed: patient identified, surgical consent, pre-op evaluation, timeout performed, IV checked, risks and benefits discussed and monitors and equipment checked  Epidural Patient position: sitting Prep: site prepped and draped and DuraPrep Patient monitoring: continuous pulse ox and blood pressure Approach: midline Injection technique: LOR air  Needle:  Needle type: Tuohy  Needle gauge: 17 G Needle length: 9 cm and 9 Needle insertion depth: 7 cm Catheter type: closed end flexible Catheter size: 19 Gauge Catheter at skin depth: 12 cm Test dose: negative and Other  Assessment Sensory level: T9 Events: blood not aspirated, injection not painful, no injection resistance, negative IV test and no paresthesia  Additional Notes Reason for block:procedure for pain

## 2013-07-04 NOTE — Progress Notes (Addendum)
Comfortable with epidural Afeb, VSS FHT- Cat I, ctx q 3-4 min VE-3/70/-2, vtx, IUPC placed Continue pitocin and monitor progress, not in active labor yet, continue PCN for +GBS

## 2013-07-04 NOTE — Progress Notes (Signed)
Comfortable Afeb, VSS FHT- Cat II, mod variability, + accels, recent variable decel. Ctx q 2-3 min on 20 mu/min pitocin VE per RN 4-5/80/-2, vtx Will continue pitocin but decrease dose, monitor progress

## 2013-07-04 NOTE — Anesthesia Preprocedure Evaluation (Signed)
Anesthesia Evaluation  Patient identified by MRN, date of birth, ID band Patient awake    Reviewed: Allergy & Precautions, H&P , NPO status , Patient's Chart, lab work & pertinent test results  Airway Mallampati: II TM Distance: >3 FB Neck ROM: full    Dental no notable dental hx.    Pulmonary neg pulmonary ROS,    Pulmonary exam normal       Cardiovascular negative cardio ROS      Neuro/Psych negative neurological ROS  negative psych ROS   GI/Hepatic negative GI ROS, Neg liver ROS,   Endo/Other  negative endocrine ROS  Renal/GU negative Renal ROS  negative genitourinary   Musculoskeletal negative musculoskeletal ROS (+)   Abdominal Normal abdominal exam  (+)   Peds negative pediatric ROS (+)  Hematology negative hematology ROS (+)   Anesthesia Other Findings   Reproductive/Obstetrics (+) Pregnancy                           Anesthesia Physical Anesthesia Plan  ASA: II  Anesthesia Plan: Epidural   Post-op Pain Management:    Induction:   Airway Management Planned:   Additional Equipment:   Intra-op Plan:   Post-operative Plan:   Informed Consent: I have reviewed the patients History and Physical, chart, labs and discussed the procedure including the risks, benefits and alternatives for the proposed anesthesia with the patient or authorized representative who has indicated his/her understanding and acceptance.     Plan Discussed with:   Anesthesia Plan Comments:         Anesthesia Quick Evaluation

## 2013-07-04 NOTE — H&P (Signed)
Morgan Walsh is a 22 y.o. female, G1 P0, EGA 37+ weeks with [facility] 12-2 presenting for evaluation of leaking fluid.  On eval in MAU, ROM confirmed.  Pt admitted, started on pitocin and PCN, has received an epidural and is comfortable.  Prenatal care complicated by symptomatic cholelithiasis, see prenatal records for complete history.  Maternal Medical History:  Reason for admission: Rupture of membranes.   Contractions: Frequency: irregular.   Perceived severity is mild.    Fetal activity: Perceived fetal activity is normal.    Prenatal complications: Cholelithiasis.     OB History   Grav Para Term Preterm Abortions TAB SAB Ect Mult Living   1              Past Medical History  Diagnosis Date  . Gall stone   . Cholestasis    Past Surgical History  Procedure Laterality Date  . No past surgeries     Family History: family history includes Diabetes in her maternal grandmother; Hypertension in her mother; Stroke in her maternal grandmother. Social History:  reports that she has never smoked. She has never used smokeless tobacco. She reports that she does not drink alcohol or use illicit drugs.   Prenatal Transfer Tool  Maternal Diabetes: No Genetic Screening: Normal Maternal Ultrasounds/Referrals: Normal Fetal Ultrasounds or other Referrals:  None Maternal Substance Abuse:  No Significant Maternal Medications:  None Significant Maternal Lab Results:  Lab values include: Group B Strep positive Other Comments:  None  Review of Systems  Respiratory: Negative.   Cardiovascular: Negative.     Dilation: 2 Effacement (%): 50 Station: -3 Exam by:: foley,rn Blood pressure 105/58, pulse 74, temperature 97.6 F (36.4 C), temperature source Oral, resp. rate 20, height 5\' 6"  (1.676 m), weight 99.791 kg (220 lb), last menstrual period 09/26/2012, SpO2 100.00%. Maternal Exam:  Uterine Assessment: Contraction strength is moderate.  Contraction frequency is irregular.   Abdomen:  Patient reports no abdominal tenderness. Estimated fetal weight is 7 1/2 lbs.   Fetal presentation: vertex  Introitus: Normal vulva. Normal vagina.  Ferning test: positive.  Amniotic fluid character: clear.  Pelvis: adequate for delivery.   Cervix: Cervix evaluated by digital exam.     Fetal Exam Fetal Monitor Review: Mode: ultrasound.   Baseline rate: 150.  Variability: moderate (6-25 bpm).   Pattern: accelerations present and no decelerations.    Fetal State Assessment: Category I - tracings are normal.     Physical Exam  Constitutional: She appears well-developed and well-nourished.  Cardiovascular: Normal rate, regular rhythm and normal heart sounds.   No murmur heard. Respiratory: Effort normal and breath sounds normal. No respiratory distress. She has no wheezes.  GI: Soft.  Gravid     Prenatal labs: ABO, Rh: --/--/A POS (11/16 0023) Antibody: NEG (11/16 0023) Rubella: Immune (05/16 0000) RPR: Nonreactive (05/16 0000)  HBsAg: Negative (05/16 0000)  HIV: Non-reactive (05/16 0000)  GBS: Positive (05/21 0000)  GCT:  104  Assessment/Plan: IUP at 37+ weeks with PROM and +GBS.  On pitocin, on PCN, will monitor progress.   Blanch Stang D 07/04/2013, 6:27 AM

## 2013-07-05 ENCOUNTER — Encounter (HOSPITAL_COMMUNITY): Payer: Self-pay | Admitting: *Deleted

## 2013-07-05 MED ORDER — SENNOSIDES-DOCUSATE SODIUM 8.6-50 MG PO TABS
2.0000 | ORAL_TABLET | ORAL | Status: DC
  Administered 2013-07-05: 2 via ORAL
  Filled 2013-07-05: qty 2

## 2013-07-05 MED ORDER — MAGNESIUM HYDROXIDE 400 MG/5ML PO SUSP: 30.0000 mL | ORAL | Status: DC | PRN

## 2013-07-05 MED ORDER — DIPHENHYDRAMINE HCL 25 MG PO CAPS: 25.0000 mg | ORAL_CAPSULE | Freq: Four times a day (QID) | ORAL | Status: DC | PRN

## 2013-07-05 MED ORDER — PRENATAL MULTIVITAMIN CH
1.0000 | ORAL_TABLET | Freq: Every day | ORAL | Status: DC
  Filled 2013-07-05: qty 1

## 2013-07-05 MED ORDER — ONDANSETRON HCL 4 MG PO TABS: 4.0000 mg | ORAL_TABLET | ORAL | Status: DC | PRN

## 2013-07-05 MED ORDER — METHYLERGONOVINE MALEATE 0.2 MG/ML IJ SOLN: 0.2000 mg | INTRAMUSCULAR | Status: DC | PRN

## 2013-07-05 MED ORDER — DIBUCAINE 1 % RE OINT: 1.0000 "application " | TOPICAL_OINTMENT | RECTAL | Status: DC | PRN

## 2013-07-05 MED ORDER — ZOLPIDEM TARTRATE 5 MG PO TABS: 5.0000 mg | ORAL_TABLET | Freq: Every evening | ORAL | Status: DC | PRN

## 2013-07-05 MED ORDER — ONDANSETRON HCL 4 MG/2ML IJ SOLN: 4.0000 mg | INTRAMUSCULAR | Status: DC | PRN

## 2013-07-05 MED ORDER — OXYCODONE-ACETAMINOPHEN 5-325 MG PO TABS
1.0000 | ORAL_TABLET | ORAL | Status: DC | PRN
  Administered 2013-07-05: 2 via ORAL
  Filled 2013-07-05: qty 2

## 2013-07-05 MED ORDER — LANOLIN HYDROUS EX OINT: TOPICAL_OINTMENT | CUTANEOUS | Status: DC | PRN

## 2013-07-05 MED ORDER — METHYLERGONOVINE MALEATE 0.2 MG PO TABS
0.2000 mg | ORAL_TABLET | ORAL | Status: DC | PRN
Start: 2013-07-05 — End: 2013-07-06

## 2013-07-05 MED ORDER — BENZOCAINE-MENTHOL 20-0.5 % EX AERO: 1.0000 "application " | INHALATION_SPRAY | CUTANEOUS | Status: DC | PRN

## 2013-07-05 MED ORDER — WITCH HAZEL-GLYCERIN EX PADS: 1.0000 "application " | MEDICATED_PAD | CUTANEOUS | Status: DC | PRN

## 2013-07-05 MED ORDER — IBUPROFEN 600 MG PO TABS
600.0000 mg | ORAL_TABLET | Freq: Four times a day (QID) | ORAL | Status: DC
  Administered 2013-07-05 – 2013-07-06 (×5): 600 mg via ORAL
  Filled 2013-07-05 (×5): qty 1

## 2013-07-05 MED ORDER — TETANUS-DIPHTH-ACELL PERTUSSIS 5-2.5-18.5 LF-MCG/0.5 IM SUSP: 0.5000 mL | Freq: Once | INTRAMUSCULAR | Status: DC

## 2013-07-05 MED ORDER — SIMETHICONE 80 MG PO CHEW: 80.0000 mg | CHEWABLE_TABLET | ORAL | Status: DC | PRN

## 2013-07-05 MED ORDER — MEASLES, MUMPS & RUBELLA VAC ~~LOC~~ INJ
0.5000 mL | INJECTION | Freq: Once | SUBCUTANEOUS | Status: DC
  Filled 2013-07-05: qty 0.5

## 2013-07-05 NOTE — Progress Notes (Signed)
Ur chart review completed.  

## 2013-07-05 NOTE — Progress Notes (Signed)
PPD #1 No problems, baby went to NICU Afeb, VSS Fundus firm, NT at U-0 Continue routine postpartum care

## 2013-07-05 NOTE — Anesthesia Postprocedure Evaluation (Signed)
  Anesthesia Post-op Note  Patient: Morgan Walsh  Procedure(s) Performed: * No procedures listed *  Patient Location: Labor unit  Anesthesia Type:Epidural  Level of Consciousness: awake, alert  and oriented  Airway and Oxygen Therapy: Patient Spontanous Breathing  Post-op Pain: none  Post-op Assessment: Post-op Vital signs reviewed, Patient's Cardiovascular Status Stable, Respiratory Function Stable, No signs of Nausea or vomiting and Pain level controlled  Post-op Vital Signs: Reviewed and stable  Complications: called by labor nuse at 2345 for persistent motor block. Infusion stopped at 2145. I have reassessed the patient at 0015. Motor block is resolving, although a bit slower than usual. Now able to flex at knees billat and flex at hip bilat. Moving both feet. Pt states that it feels less numb than 30 min ago. Will be transferred to mother baby unit. I discussed importance of complete resolution of the motor block over the next 60 min or so and stressed the importance of telling a nurse without delay if symptoms persist or worsen.

## 2013-07-05 NOTE — Anesthesia Postprocedure Evaluation (Signed)
  Anesthesia Post-op Note  Patient: Morgan Walsh  Procedure(s) Performed: * No procedures listed *  Patient Location: Mother/Baby  Anesthesia Type:Epidural  Level of Consciousness: awake, alert , oriented and patient cooperative  Airway and Oxygen Therapy: Patient Spontanous Breathing  Post-op Pain: mild  Post-op Assessment: Patient's Cardiovascular Status Stable, Respiratory Function Stable, Patent Airway, No signs of Nausea or vomiting, Adequate PO intake and Pain level controlled  Post-op Vital Signs: stable  Complications: No apparent anesthesia complications

## 2013-07-06 MED ORDER — IBUPROFEN 600 MG PO TABS: 600.0000 mg | ORAL_TABLET | Freq: Four times a day (QID) | ORAL | Status: AC

## 2013-07-06 MED ORDER — OXYCODONE-ACETAMINOPHEN 5-325 MG PO TABS: 1.0000 | ORAL_TABLET | ORAL | Status: DC | PRN

## 2013-07-06 NOTE — Discharge Summary (Signed)
Obstetric Discharge Summary Reason for Admission: rupture of membranes Prenatal Procedures: none Intrapartum Procedures: spontaneous vaginal delivery Postpartum Procedures: none Complications-Operative and Postpartum: none Hemoglobin  Date Value Range Status  07/04/2013 12.2  12.0 - 15.0 g/dL Final     HCT  Date Value Range Status  07/04/2013 36.4  36.0 - 46.0 % Final    Physical Exam:  General: alert Lochia: appropriate Uterine Fundus: firm  Discharge Diagnoses: Term Pregnancy-delivered and PROM  Discharge Information: Date: 07/06/2013 Activity: pelvic rest Diet: routine Medications: Ibuprofen and Percocet Condition: stable Instructions: refer to practice specific booklet Discharge to: home Follow-up Information   Follow up with Dalton Mille D, MD. Schedule an appointment as soon as possible for a visit in 6 weeks.   Specialty:  Obstetrics and Gynecology   Contact information:   [address] [place] [address] [phone]       Newborn Data: Live born female  Birth Weight: 4 lb 14.1 oz (2215 g) APGAR: 8, 9  Baby in NICU.  Meldrick Buttery D 07/06/2013, 8:17 AM

## 2013-07-06 NOTE — Progress Notes (Signed)
PPD #2 No problems, baby stable in NICU Afeb, VSS D/c home

## 2013-07-19 DIAGNOSIS — K8020 Calculus of gallbladder without cholecystitis without obstruction: Secondary | ICD-10-CM

## 2013-07-19 HISTORY — DX: Calculus of gallbladder without cholecystitis without obstruction: K80.20

## 2013-07-22 ENCOUNTER — Encounter (INDEPENDENT_AMBULATORY_CARE_PROVIDER_SITE_OTHER): Payer: Self-pay | Admitting: Surgery

## 2013-07-22 ENCOUNTER — Ambulatory Visit (INDEPENDENT_AMBULATORY_CARE_PROVIDER_SITE_OTHER): Payer: Medicaid Other | Admitting: Surgery

## 2013-07-22 ENCOUNTER — Encounter (INDEPENDENT_AMBULATORY_CARE_PROVIDER_SITE_OTHER): Payer: Self-pay

## 2013-07-22 ENCOUNTER — Encounter (HOSPITAL_BASED_OUTPATIENT_CLINIC_OR_DEPARTMENT_OTHER): Payer: Self-pay | Admitting: *Deleted

## 2013-07-22 VITALS — BP 106/64 | HR 68 | Temp 97.4°F | Resp 16 | Ht 66.0 in | Wt 209.8 lb

## 2013-07-22 DIAGNOSIS — K8020 Calculus of gallbladder without cholecystitis without obstruction: Secondary | ICD-10-CM

## 2013-07-22 NOTE — Progress Notes (Signed)
Subjective:     Patient ID: Morgan Walsh, female   DOB: 22-Mar-1991, 22 y.o.   MRN: [id number]  HPI She is here for a followup of her symptomatic cholelithiasis. She has now had her baby and is doing well. She still has intermittent attacks of epigastric abdominal pain hurting into the chest and the back. She also has occasional nausea  Review of Systems     Objective:   Physical Exam On exam, she is well her parents. Her abdomen is soft and nontender    Assessment:     Symptomatic cholelithiasis     Plan:     I will  schedule her for laparoscopic cholecystectomy. I again discussed the surgery and the risks.

## 2013-07-26 ENCOUNTER — Encounter (HOSPITAL_BASED_OUTPATIENT_CLINIC_OR_DEPARTMENT_OTHER)
Admission: RE | Disposition: A | Discharge status: Home / Self Care | Payer: Self-pay | Source: Ambulatory Visit | Attending: Surgery

## 2013-07-26 ENCOUNTER — Ambulatory Visit (HOSPITAL_BASED_OUTPATIENT_CLINIC_OR_DEPARTMENT_OTHER)
Admission: RE | Admit: 2013-07-26 | Discharge: 2013-07-26 | Disposition: A | Discharge status: Home / Self Care | Payer: Medicaid Other | Source: Ambulatory Visit | Attending: Surgery | Admitting: Surgery

## 2013-07-26 ENCOUNTER — Ambulatory Visit (HOSPITAL_BASED_OUTPATIENT_CLINIC_OR_DEPARTMENT_OTHER): Payer: Medicaid Other | Admitting: Anesthesiology

## 2013-07-26 ENCOUNTER — Encounter (HOSPITAL_BASED_OUTPATIENT_CLINIC_OR_DEPARTMENT_OTHER): Payer: Self-pay

## 2013-07-26 ENCOUNTER — Encounter (HOSPITAL_BASED_OUTPATIENT_CLINIC_OR_DEPARTMENT_OTHER): Payer: Medicaid Other | Admitting: Anesthesiology

## 2013-07-26 DIAGNOSIS — K8010 Calculus of gallbladder with chronic cholecystitis without obstruction: Secondary | ICD-10-CM

## 2013-07-26 DIAGNOSIS — K219 Gastro-esophageal reflux disease without esophagitis: Secondary | ICD-10-CM | POA: Insufficient documentation

## 2013-07-26 DIAGNOSIS — O26899 Other specified pregnancy related conditions, unspecified trimester: Secondary | ICD-10-CM | POA: Insufficient documentation

## 2013-07-26 HISTORY — DX: Gastro-esophageal reflux disease without esophagitis: K21.9

## 2013-07-26 HISTORY — DX: Calculus of gallbladder without cholecystitis without obstruction: K80.20

## 2013-07-26 HISTORY — PX: CHOLECYSTECTOMY: SHX55

## 2013-07-26 LAB — POCT HEMOGLOBIN-HEMACUE: Hemoglobin: 12.6 g/dL (ref 12.0–15.0)

## 2013-07-26 SURGERY — LAPAROSCOPIC CHOLECYSTECTOMY
Anesthesia: General | Site: Abdomen

## 2013-07-26 MED ORDER — FENTANYL CITRATE 0.05 MG/ML IJ SOLN: 50.0000 ug | INTRAMUSCULAR | Status: DC | PRN

## 2013-07-26 MED ORDER — ACETAMINOPHEN 325 MG PO TABS: 650.0000 mg | ORAL_TABLET | ORAL | Status: DC | PRN

## 2013-07-26 MED ORDER — FENTANYL CITRATE 0.05 MG/ML IJ SOLN
INTRAMUSCULAR | Status: DC | PRN
  Administered 2013-07-26 (×4): 50 ug via INTRAVENOUS

## 2013-07-26 MED ORDER — GLYCOPYRROLATE 0.2 MG/ML IJ SOLN
INTRAMUSCULAR | Status: DC | PRN
  Administered 2013-07-26: .5 mg via INTRAVENOUS

## 2013-07-26 MED ORDER — OXYCODONE HCL 5 MG PO TABS: 5.0000 mg | ORAL_TABLET | Freq: Once | ORAL | Status: DC | PRN

## 2013-07-26 MED ORDER — SODIUM CHLORIDE 0.9 % IJ SOLN: 3.0000 mL | Freq: Two times a day (BID) | INTRAMUSCULAR | Status: DC

## 2013-07-26 MED ORDER — METOCLOPRAMIDE HCL 5 MG/ML IJ SOLN
INTRAMUSCULAR | Status: AC
  Filled 2013-07-26: qty 2

## 2013-07-26 MED ORDER — ONDANSETRON HCL 4 MG/2ML IJ SOLN: 4.0000 mg | Freq: Four times a day (QID) | INTRAMUSCULAR | Status: DC | PRN

## 2013-07-26 MED ORDER — MIDAZOLAM HCL 2 MG/ML PO SYRP: 12.0000 mg | ORAL_SOLUTION | Freq: Once | ORAL | Status: DC | PRN

## 2013-07-26 MED ORDER — METOCLOPRAMIDE HCL 5 MG/ML IJ SOLN
10.0000 mg | Freq: Once | INTRAMUSCULAR | Status: AC | PRN
  Administered 2013-07-26: 10 mg via INTRAVENOUS

## 2013-07-26 MED ORDER — NEOSTIGMINE METHYLSULFATE 1 MG/ML IJ SOLN
INTRAMUSCULAR | Status: DC | PRN
  Administered 2013-07-26: 3 mg via INTRAVENOUS

## 2013-07-26 MED ORDER — OXYCODONE HCL 5 MG PO TABS: 5.0000 mg | ORAL_TABLET | ORAL | Status: DC | PRN

## 2013-07-26 MED ORDER — SODIUM CHLORIDE 0.9 % IJ SOLN: 3.0000 mL | INTRAMUSCULAR | Status: DC | PRN

## 2013-07-26 MED ORDER — BUPIVACAINE-EPINEPHRINE PF 0.5-1:200000 % IJ SOLN
INTRAMUSCULAR | Status: DC | PRN
  Administered 2013-07-26: 20 mL

## 2013-07-26 MED ORDER — KETOROLAC TROMETHAMINE 30 MG/ML IJ SOLN
INTRAMUSCULAR | Status: DC | PRN
  Administered 2013-07-26: 30 mg via INTRAVENOUS

## 2013-07-26 MED ORDER — SODIUM CHLORIDE 0.9 % IR SOLN
Status: DC | PRN
  Administered 2013-07-26: 1

## 2013-07-26 MED ORDER — MORPHINE SULFATE 4 MG/ML IJ SOLN: 4.0000 mg | INTRAMUSCULAR | Status: DC | PRN

## 2013-07-26 MED ORDER — MIDAZOLAM HCL 5 MG/5ML IJ SOLN
INTRAMUSCULAR | Status: DC | PRN
  Administered 2013-07-26: 2 mg via INTRAVENOUS

## 2013-07-26 MED ORDER — ROCURONIUM BROMIDE 100 MG/10ML IV SOLN
INTRAVENOUS | Status: DC | PRN
  Administered 2013-07-26: 30 mg via INTRAVENOUS

## 2013-07-26 MED ORDER — ACETAMINOPHEN 650 MG RE SUPP: 650.0000 mg | RECTAL | Status: DC | PRN

## 2013-07-26 MED ORDER — SODIUM CHLORIDE 0.9 % IV SOLN: 250.0000 mL | INTRAVENOUS | Status: DC | PRN

## 2013-07-26 MED ORDER — DEXAMETHASONE SODIUM PHOSPHATE 4 MG/ML IJ SOLN
INTRAMUSCULAR | Status: DC | PRN
  Administered 2013-07-26: 10 mg via INTRAVENOUS

## 2013-07-26 MED ORDER — OXYCODONE HCL 5 MG/5ML PO SOLN: 5.0000 mg | Freq: Once | ORAL | Status: DC | PRN

## 2013-07-26 MED ORDER — MIDAZOLAM HCL 2 MG/2ML IJ SOLN: 1.0000 mg | INTRAMUSCULAR | Status: DC | PRN

## 2013-07-26 MED ORDER — MIDAZOLAM HCL 2 MG/2ML IJ SOLN
INTRAMUSCULAR | Status: AC
  Filled 2013-07-26: qty 2

## 2013-07-26 MED ORDER — LIDOCAINE HCL (CARDIAC) 10 MG/ML IV SOLN
INTRAVENOUS | Status: DC | PRN
  Administered 2013-07-26: 80 mg via INTRAVENOUS

## 2013-07-26 MED ORDER — HYDROMORPHONE HCL PF 1 MG/ML IJ SOLN
0.2500 mg | INTRAMUSCULAR | Status: DC | PRN
  Administered 2013-07-26: 0.5 mg via INTRAVENOUS

## 2013-07-26 MED ORDER — LACTATED RINGERS IV SOLN
INTRAVENOUS | Status: DC
  Administered 2013-07-26: 12:00:00 via INTRAVENOUS
  Administered 2013-07-26: 20 mL/h via INTRAVENOUS
  Administered 2013-07-26: 11:00:00 via INTRAVENOUS

## 2013-07-26 MED ORDER — HYDROMORPHONE HCL PF 1 MG/ML IJ SOLN
INTRAMUSCULAR | Status: AC
  Filled 2013-07-26: qty 1

## 2013-07-26 MED ORDER — PROPOFOL 10 MG/ML IV BOLUS
INTRAVENOUS | Status: DC | PRN
  Administered 2013-07-26: 200 mg via INTRAVENOUS

## 2013-07-26 MED ORDER — CEFAZOLIN SODIUM-DEXTROSE 2-3 GM-% IV SOLR
2.0000 g | INTRAVENOUS | Status: AC
  Administered 2013-07-26: 2 g via INTRAVENOUS

## 2013-07-26 MED ORDER — FENTANYL CITRATE 0.05 MG/ML IJ SOLN
INTRAMUSCULAR | Status: AC
  Filled 2013-07-26: qty 6

## 2013-07-26 MED ORDER — CEFAZOLIN SODIUM-DEXTROSE 2-3 GM-% IV SOLR
INTRAVENOUS | Status: AC
  Filled 2013-07-26: qty 50

## 2013-07-26 MED ORDER — HYDROCODONE-ACETAMINOPHEN 5-325 MG PO TABS: 1.0000 | ORAL_TABLET | ORAL | Status: DC | PRN

## 2013-07-26 SURGICAL SUPPLY — 40 items
APPLIER CLIP 5 13 M/L LIGAMAX5 (MISCELLANEOUS) ×2
BANDAGE ADHESIVE 1X3 (GAUZE/BANDAGES/DRESSINGS) ×10 IMPLANT
BENZOIN TINCTURE PRP APPL 2/3 (GAUZE/BANDAGES/DRESSINGS) ×2 IMPLANT
BLADE SURG ROTATE 9660 (MISCELLANEOUS) IMPLANT
CANISTER SUCT 3000ML (MISCELLANEOUS) ×2 IMPLANT
CHLORAPREP W/TINT 26ML (MISCELLANEOUS) ×2 IMPLANT
CLIP APPLIE 5 13 M/L LIGAMAX5 (MISCELLANEOUS) ×1 IMPLANT
COVER MAYO STAND STRL (DRAPES) IMPLANT
DECANTER SPIKE VIAL GLASS SM (MISCELLANEOUS) ×2 IMPLANT
DRAPE C-ARM 42X72 X-RAY (DRAPES) IMPLANT
DRAPE UTILITY XL STRL (DRAPES) ×2 IMPLANT
ELECT REM PT RETURN 9FT ADLT (ELECTROSURGICAL) ×2
ELECTRODE REM PT RTRN 9FT ADLT (ELECTROSURGICAL) ×1 IMPLANT
FILTER SMOKE EVAC LAPAROSHD (FILTER) ×2 IMPLANT
GLOVE BIO SURGEON STRL SZ7 (GLOVE) ×2 IMPLANT
GLOVE BIOGEL PI IND STRL 7.0 (GLOVE) ×1 IMPLANT
GLOVE BIOGEL PI IND STRL 7.5 (GLOVE) ×1 IMPLANT
GLOVE BIOGEL PI INDICATOR 7.0 (GLOVE) ×1
GLOVE BIOGEL PI INDICATOR 7.5 (GLOVE) ×1
GLOVE ECLIPSE 6.5 STRL STRAW (GLOVE) ×2 IMPLANT
GLOVE EXAM NITRILE PF LG BLUE (GLOVE) ×2 IMPLANT
GLOVE SURG SIGNA 7.5 PF LTX (GLOVE) ×2 IMPLANT
GOWN BRE IMP PREV XXLGXLNG (GOWN DISPOSABLE) ×2 IMPLANT
GOWN PREVENTION PLUS XLARGE (GOWN DISPOSABLE) ×6 IMPLANT
NS IRRIG 1000ML POUR BTL (IV SOLUTION) ×2 IMPLANT
PACK BASIN DAY SURGERY FS (CUSTOM PROCEDURE TRAY) ×2 IMPLANT
POUCH SPECIMEN RETRIEVAL 10MM (ENDOMECHANICALS) ×2 IMPLANT
SCISSORS LAP 5X35 DISP (ENDOMECHANICALS) IMPLANT
SET CHOLANGIOGRAPH 5 50 .035 (SET/KITS/TRAYS/PACK) IMPLANT
SET IRRIG TUBING LAPAROSCOPIC (IRRIGATION / IRRIGATOR) ×2 IMPLANT
SLEEVE ENDOPATH XCEL 5M (ENDOMECHANICALS) ×4 IMPLANT
SLEEVE SCD COMPRESS KNEE MED (MISCELLANEOUS) ×2 IMPLANT
SPECIMEN JAR SMALL (MISCELLANEOUS) IMPLANT
SUT MON AB 4-0 PC3 18 (SUTURE) ×2 IMPLANT
TOWEL OR 17X24 6PK STRL BLUE (TOWEL DISPOSABLE) ×2 IMPLANT
TOWEL OR NON WOVEN STRL DISP B (DISPOSABLE) ×2 IMPLANT
TRAY LAPAROSCOPIC (CUSTOM PROCEDURE TRAY) ×2 IMPLANT
TROCAR XCEL BLUNT TIP 100MML (ENDOMECHANICALS) ×4 IMPLANT
TROCAR XCEL NON-BLD 5MMX100MML (ENDOMECHANICALS) ×2 IMPLANT
TUBE CONNECTING 20X1/4 (TUBING) ×2 IMPLANT

## 2013-07-26 NOTE — Anesthesia Procedure Notes (Signed)
Procedure Name: Intubation Date/Time: 07/26/2013 11:15 AM Performed by: Gar Gibbon Pre-anesthesia Checklist: Patient identified, Emergency Drugs available, Suction available and Patient being monitored Patient Re-evaluated:Patient Re-evaluated prior to inductionOxygen Delivery Method: Circle System Utilized Preoxygenation: Pre-oxygenation with 100% oxygen Intubation Type: IV induction Ventilation: Mask ventilation without difficulty Laryngoscope Size: Mac and 3 Grade View: Grade I Tube type: Oral Tube size: 7.0 mm Number of attempts: 1 Airway Equipment and Method: stylet and oral airway Placement Confirmation: ETT inserted through vocal cords under direct vision,  positive ETCO2 and breath sounds checked- equal and bilateral Secured at: 21 cm Tube secured with: Tape Dental Injury: Teeth and Oropharynx as per pre-operative assessment

## 2013-07-26 NOTE — Op Note (Signed)
Laparoscopic Cholecystectomy Procedure Note  Indications: This patient presents with symptomatic gallbladder disease and will undergo laparoscopic cholecystectomy.  Pre-operative Diagnosis: Calculus of gallbladder without mention of cholecystitis or obstruction  Post-operative Diagnosis: Same  Surgeon: Abigail Miyamoto A   Assistants: 0  Anesthesia: General endotracheal anesthesia  ASA Class: 1  Procedure Details  The patient was seen again in the Holding Room. The risks, benefits, complications, treatment options, and expected outcomes were discussed with the patient. The possibilities of reaction to medication, pulmonary aspiration, perforation of viscus, bleeding, recurrent infection, finding a normal gallbladder, the need for additional procedures, failure to diagnose a condition, the possible need to convert to an open procedure, and creating a complication requiring transfusion or operation were discussed with the patient. The likelihood of improving the patient's symptoms with return to their baseline status is good.  The patient and/or family concurred with the proposed plan, giving informed consent. The site of surgery properly noted. The patient was taken to Operating Room, identified as Morgan Walsh and the procedure verified as Laparoscopic Cholecystectomy with Intraoperative Cholangiogram. A Time Out was held and the above information confirmed.  Prior to the induction of general anesthesia, antibiotic prophylaxis was administered. General endotracheal anesthesia was then administered and tolerated well. After the induction, the abdomen was prepped with Chloraprep and draped in sterile fashion. The patient was positioned in the supine position.  Local anesthetic agent was injected into the skin near the umbilicus and an incision made. We dissected down to the abdominal fascia with blunt dissection.  The fascia was incised vertically and we entered the peritoneal cavity bluntly.   A pursestring suture of 0-Vicryl was placed around the fascial opening.  The Hasson cannula was inserted and secured with the stay suture.  Pneumoperitoneum was then created with CO2 and tolerated well without any adverse changes in the patient's vital signs. An 11-mm port was placed in the subxiphoid position.  Two 5-mm ports were placed in the right upper quadrant. All skin incisions were infiltrated with a local anesthetic agent before making the incision and placing the trocars.   We positioned the patient in reverse Trendelenburg, tilted slightly to the patient's left.  The gallbladder was identified, the fundus grasped and retracted cephalad. Adhesions were lysed bluntly and with the electrocautery where indicated, taking care not to injure any adjacent organs or viscus. The infundibulum was grasped and retracted laterally, exposing the peritoneum overlying the triangle of Calot. This was then divided and exposed in a blunt fashion. The cystic duct was clearly identified and bluntly dissected circumferentially. A critical view of the cystic duct and cystic artery was obtained.  The cystic duct was then ligated with clips and divided. The cystic artery was, dissected free, ligated with clips and divided as well.   The gallbladder was dissected from the liver bed in retrograde fashion with the electrocautery. The gallbladder was removed and placed in an Endocatch sac. The liver bed was irrigated and inspected. Hemostasis was achieved with the electrocautery. Copious irrigation was utilized and was repeatedly aspirated until clear.  The gallbladder and Endocatch sac were then removed through the umbilical port site.  The pursestring suture was used to close the umbilical fascia.    We again inspected the right upper quadrant for hemostasis.  Pneumoperitoneum was released as we removed the trocars.  4-0 Monocryl was used to close the skin.   Benzoin, steri-strips, and clean dressings were applied. The  patient was then extubated and brought to the recovery room  in stable condition. Instrument, sponge, and needle counts were correct at closure and at the conclusion of the case.   Findings: Chronic Cholecystitis with Cholelithiasis  Estimated Blood Loss: Minimal         Drains: 0         Specimens: Gallbladder           Complications: None; patient tolerated the procedure well.         Disposition: PACU - hemodynamically stable.         Condition: stable

## 2013-07-26 NOTE — Transfer of Care (Signed)
Immediate Anesthesia Transfer of Care Note  Patient: Morgan Walsh  Procedure(s) Performed: Procedure(s): LAPAROSCOPIC CHOLECYSTECTOMY (N/A)  Patient Location: PACU  Anesthesia Type:General  Level of Consciousness: sedated and patient cooperative  Airway & Oxygen Therapy: Patient Spontanous Breathing and Patient connected to face mask oxygen  Post-op Assessment: Report given to PACU RN and Post -op Vital signs reviewed and stable  Post vital signs: Reviewed and stable  Complications: No apparent anesthesia complications

## 2013-07-26 NOTE — Anesthesia Preprocedure Evaluation (Signed)
Anesthesia Evaluation  Patient identified by MRN, date of birth, ID band Patient awake    Reviewed: Allergy & Precautions, H&P , NPO status , Patient's Chart, lab work & pertinent test results, reviewed documented beta blocker date and time   Airway Mallampati: II TM Distance: >3 FB Neck ROM: full    Dental   Pulmonary neg pulmonary ROS,  breath sounds clear to auscultation        Cardiovascular negative cardio ROS  Rhythm:regular     Neuro/Psych negative neurological ROS  negative psych ROS   GI/Hepatic Neg liver ROS, GERD-  Medicated and Controlled,  Endo/Other  negative endocrine ROS  Renal/GU negative Renal ROS  negative genitourinary   Musculoskeletal   Abdominal   Peds  Hematology negative hematology ROS (+)   Anesthesia Other Findings See surgeon's H&P   Reproductive/Obstetrics negative OB ROS                           Anesthesia Physical Anesthesia Plan  ASA: II  Anesthesia Plan: General   Post-op Pain Management:    Induction: Intravenous  Airway Management Planned: Oral ETT  Additional Equipment:   Intra-op Plan:   Post-operative Plan: Extubation in OR  Informed Consent: I have reviewed the patients History and Physical, chart, labs and discussed the procedure including the risks, benefits and alternatives for the proposed anesthesia with the patient or authorized representative who has indicated his/her understanding and acceptance.   Dental Advisory Given  Plan Discussed with: CRNA and Surgeon  Anesthesia Plan Comments:         Anesthesia Quick Evaluation  

## 2013-07-26 NOTE — H&P (Signed)
Patient ID: Morgan Walsh, female DOB: 14-Jun-1991, 22 y.o. MRN: [id number]  Chief Complaint   Patient presents with   .  New Evaluation     eval gb   HPI  Morgan Walsh is a 22 y.o. female.  HPI  This is a very pleasant female referred by Dr. Ambrose Mantle for evaluation of symptomatic cholelithiasis. She is now post partut. She has occasional attacks of right upper quadrant abdominal pain and nausea and vomiting. She has had to present to [facility]. She quickly improves and is now symptom free. This had occurred after a fatty meal. The pain was moderate in intensity and did not refer any where else. Again, she has had no further attacks of discomfort.  Past Medical History   Diagnosis  Date   .  Medical history non-contributory     Past Surgical History   Procedure  Laterality  Date   .  No past surgeries     History reviewed. No pertinent family history.  Social History  History   Substance Use Topics   .  Smoking status:  Never Smoker   .  Smokeless tobacco:  Never Used   .  Alcohol Use:  No   No Known Allergies  No current outpatient prescriptions on file.    No current facility-administered medications for this visit.   Review of Systems  Review of Systems  Constitutional: Negative for fever, chills and unexpected weight change.  HENT: Negative for hearing loss, congestion, sore throat, trouble swallowing and voice change.  Eyes: Negative for visual disturbance.  Respiratory: Negative for cough and wheezing.  Cardiovascular: Negative for chest pain, palpitations and leg swelling.  Gastrointestinal: Positive for nausea and abdominal pain. Negative for vomiting, diarrhea, constipation, blood in stool, abdominal distention and anal bleeding.  Genitourinary: Negative for hematuria, vaginal bleeding and difficulty urinating.  Musculoskeletal: Negative for arthralgias.  Skin: Negative for rash and wound.  Neurological: Negative for seizures, syncope and headaches.   Hematological: Negative for adenopathy. Does not bruise/bleed easily.  Psychiatric/Behavioral: Negative for confusion.  Blood pressure 118/68, pulse 64, temperature 97.9 F (36.6 C), temperature source Temporal, resp. rate 14, height 5\' 7"  (1.702 m), weight 226 lb (102.513 kg), last menstrual period 09/26/2012.  Physical Exam  Physical Exam  Constitutional: She is oriented to person, place, and time. She appears well-developed and well-nourished. No distress.  HENT:  Head: Normocephalic and atraumatic.  Right Ear: External ear normal.  Left Ear: External ear normal.  Nose: Nose normal.  Mouth/Throat: No oropharyngeal exudate.  Eyes: Conjunctivae are normal. Pupils are equal, round, and reactive to light. Right eye exhibits no discharge. Left eye exhibits no discharge. No scleral icterus.  Neck: Normal range of motion. Neck supple. No tracheal deviation present.  Cardiovascular: Normal rate, regular rhythm, normal heart sounds and intact distal pulses.  No murmur heard.  Pulmonary/Chest: Effort normal and breath sounds normal. No respiratory distress. She has no wheezes.  Abdominal: Soft. Bowel sounds are normal.  There is no tenderness or guarding in the right upper quadrant or anywhere in the rest of the abdomen  Musculoskeletal: Normal range of motion. She exhibits no edema and no tenderness.  Lymphadenopathy:  She has no cervical adenopathy.  Neurological: She is alert and oriented to person, place, and time. No cranial nerve deficit.  Skin: Skin is warm and dry. No rash noted. No erythema.  Psychiatric: Her behavior is normal. Judgment normal.   Data Reviewed  I have reviewed her ultrasound showing  cholelithiasis. There is no gallbladder wall thickening in the bile duct is normal. Liver function tests are normal   Assessment  Symptomatic cholelithiasis  Plan  Lap chole is now recommended as she is post partum. I explained gallbladder disease to her in detail and gave her  literature regarding the surgery. I discussed laps have a cholecystectomy with her in detail as well. The risks were also discussed in detail including but not limited to bleeding, infection, bile duct injury, bile leak, the need to convert to an open procedure, etc.  She agrees to proceed.

## 2013-07-27 NOTE — Anesthesia Postprocedure Evaluation (Signed)
Anesthesia Post Note  Patient: Morgan Walsh  Procedure(s) Performed: Procedure(s) (LRB): LAPAROSCOPIC CHOLECYSTECTOMY (N/A)  Anesthesia type: General  Patient location: PACU  Post pain: Pain level controlled  Post assessment: Patient's Cardiovascular Status Stable  Last Vitals:  Filed Vitals:   07/26/13 1516  BP: 122/70  Pulse: 62  Temp: 36.6 C  Resp: 16    Post vital signs: Reviewed and stable  Level of consciousness: alert  Complications: No apparent anesthesia complications

## 2013-07-28 ENCOUNTER — Encounter (HOSPITAL_BASED_OUTPATIENT_CLINIC_OR_DEPARTMENT_OTHER): Payer: Self-pay | Admitting: Surgery

## 2013-08-11 IMAGING — US US ABDOMEN COMPLETE
1 series · 13 of 25 positions shown · non-contrast
Comparison: None

CLINICAL DATA: Upper abdominal pain.

ABDOMINAL ULTRASOUND COMPLETE

[Series 1: us abdomen complete · 0.30mm/px · 13 of 43 slices shown]
[im 1/43]
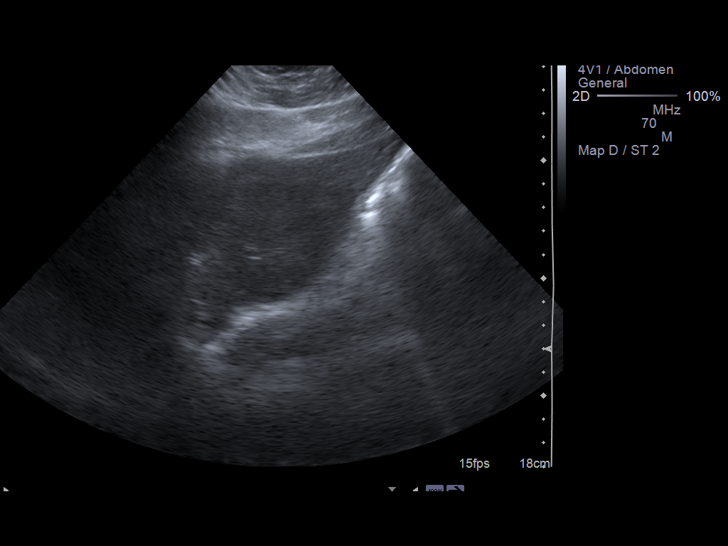
[im 4/43]
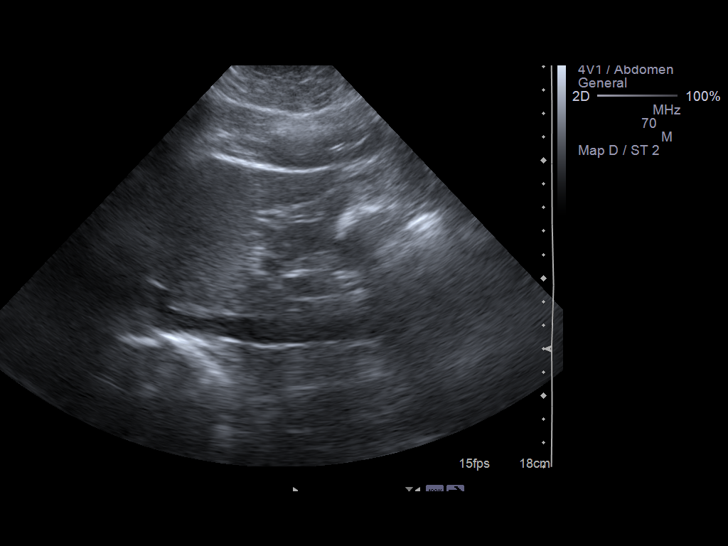
[im 8/43]
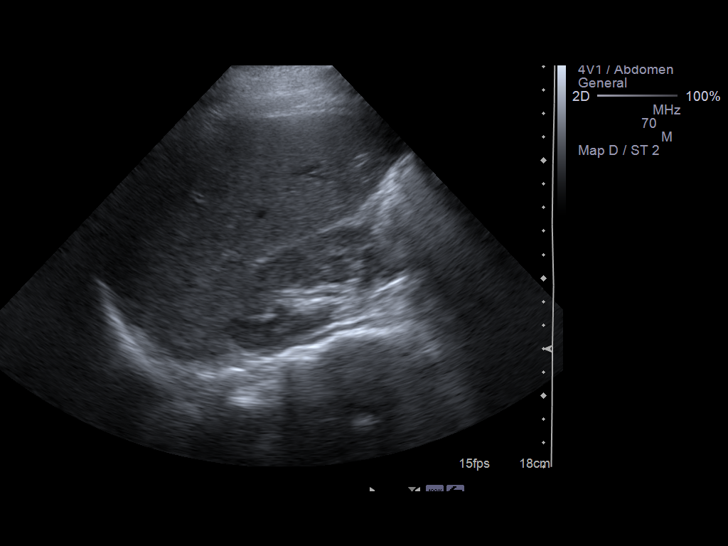
[im 11/43]
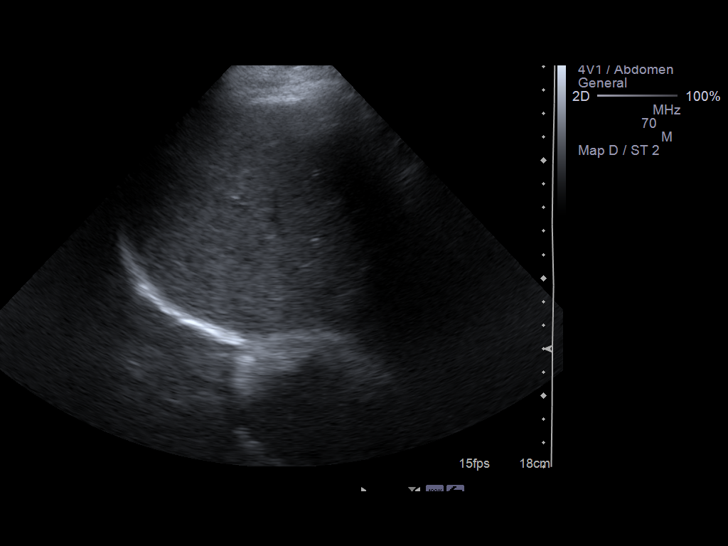
[im 15/43]
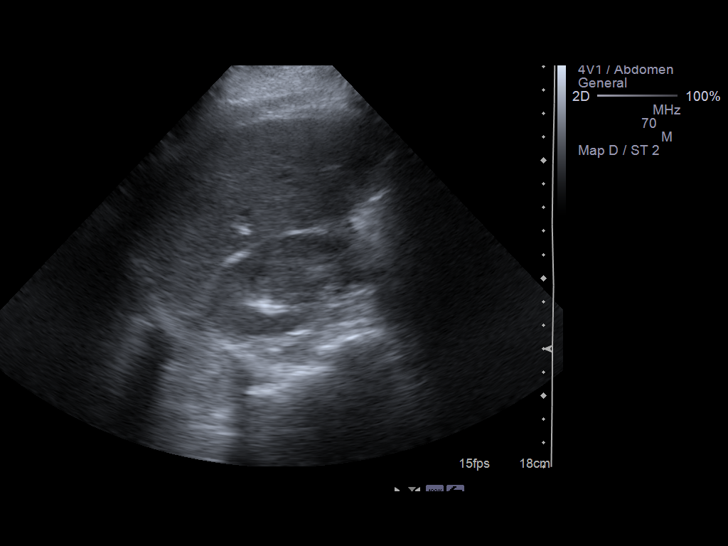
[im 18/43]
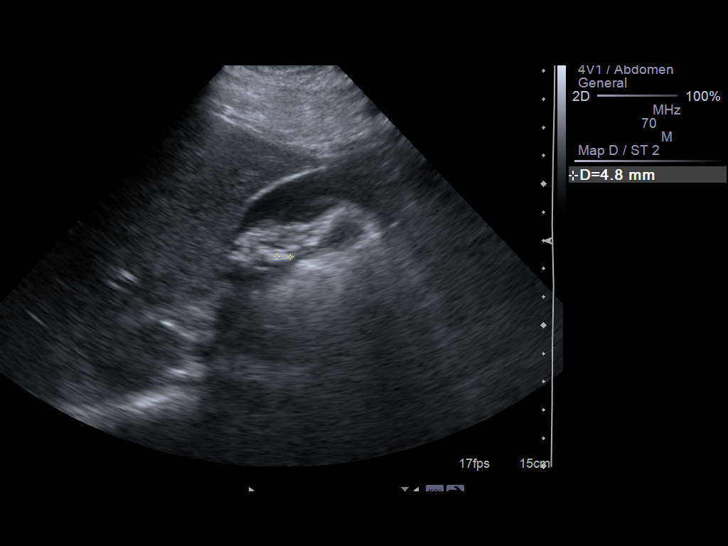
[im 22/43]
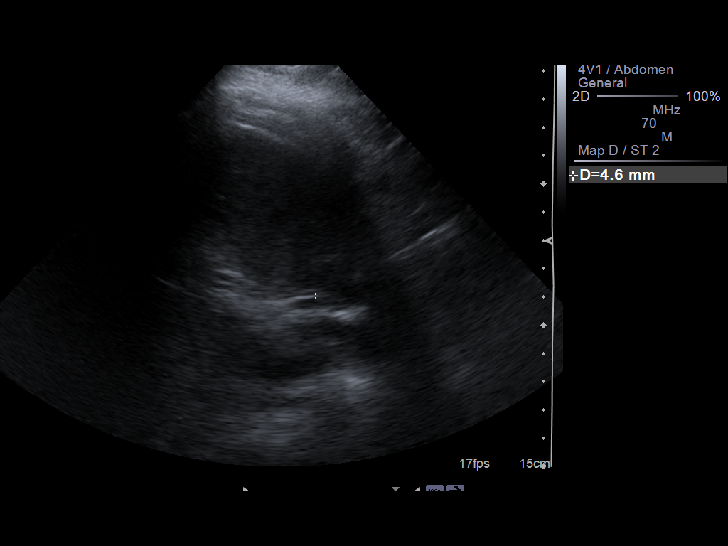
[im 25/43]
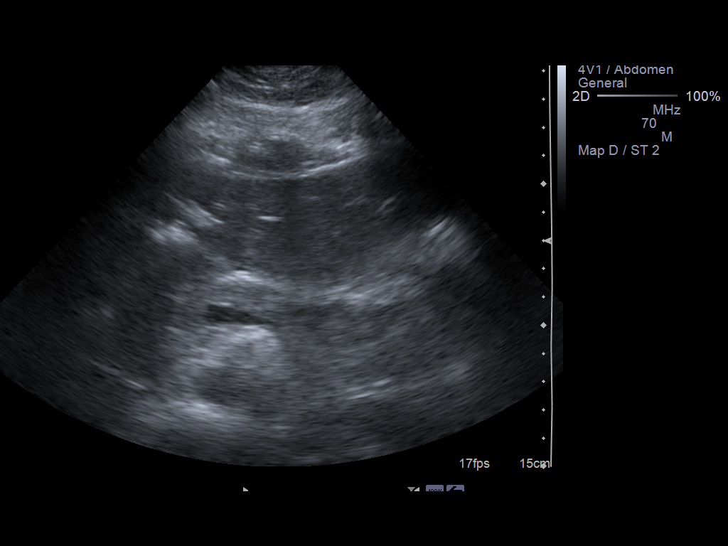
[im 29/43]
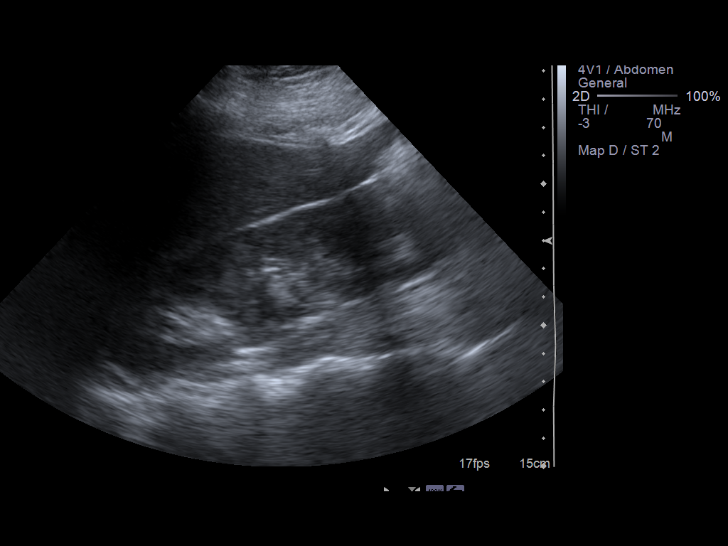
[im 32/43]
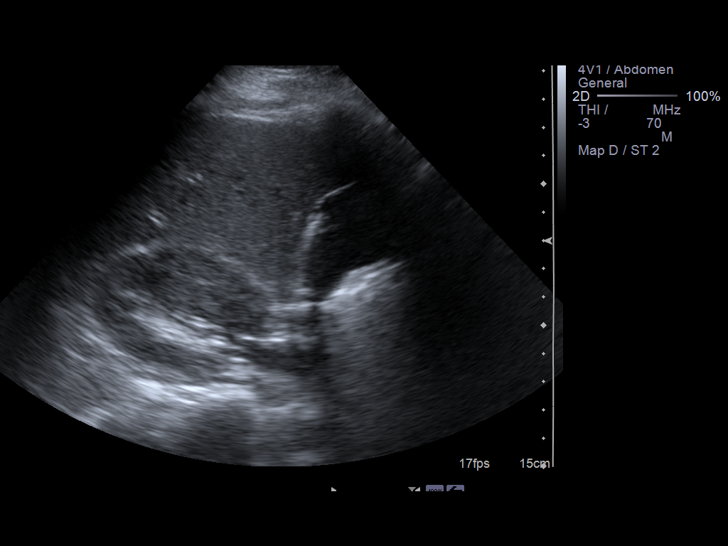
[im 36/43]
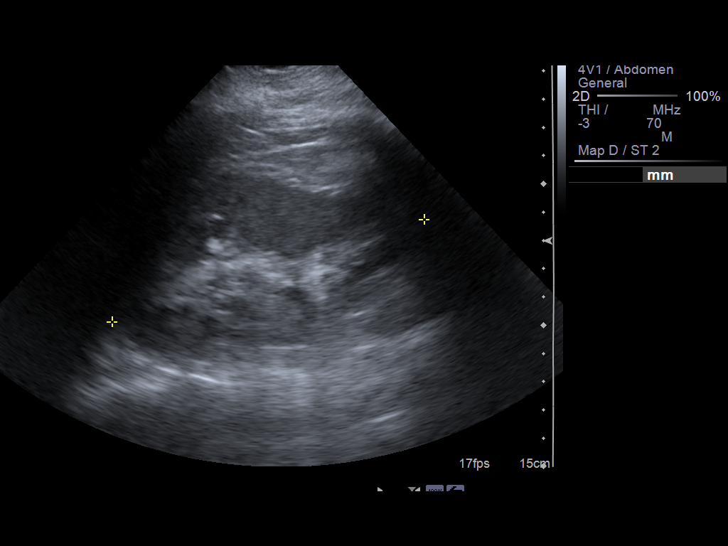
[im 39/43]
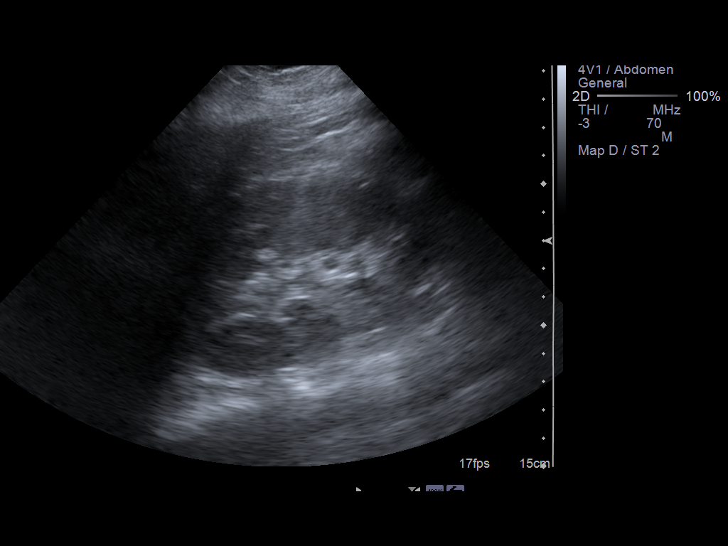
[im 43/43]
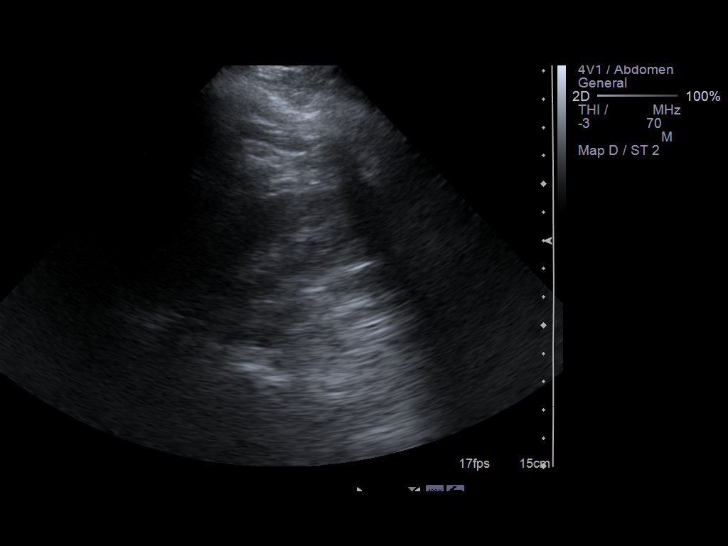

[13 of 25 positions shown; findings below may reference images not displayed]

FINDINGS: Gallbladder:  Small stones are seen partially filling the
gallbladder, with associated echogenic sludge.  No gallbladder wall
thickening or pericholecystic fluid is seen to suggest
cholecystitis.  No ultrasonographic Murphy's sign is elicited.

Common Bile Duct:  0.5 cm in diameter; within normal limits in
caliber.

Liver:  Normal parenchymal echogenicity and echotexture; no focal
lesions identified.  Limited Doppler evaluation demonstrates normal
blood flow within the liver.

IVC:  Unremarkable in appearance.

Pancreas:  Although the pancreas is difficult to visualize in its
entirety due to overlying bowel gas, no focal pancreatic
abnormality is identified.

Spleen:  9.3 cm in length; within normal limits in size and
echotexture.

Right kidney:  14.2 cm in length; enlarged, though normal in
configuration and parenchymal echogenicity.  No evidence of mass or
hydronephrosis.

Left kidney:  11.6 cm in length; normal in size, configuration and
parenchymal echogenicity.  No evidence of mass or hydronephrosis.

Abdominal Aorta:  Normal in caliber; no aneurysm identified.
IMPRESSION: 1.  Cholelithiasis noted, with associated echogenic sludge.  No
evidence for obstruction or cholecystitis; gallbladder otherwise
unremarkable in appearance.
2.  Right-sided nephromegaly incidentally noted; kidneys otherwise
unremarkable in appearance.

## 2013-08-12 IMAGING — CR DG CHEST 1V
1 series · 1 of 1 positions shown · non-contrast
Comparison: None.

CLINICAL DATA: Epigastric pain radiating to the back.  6 months
pregnant.

CHEST - 1 VIEW

[x chest ap]
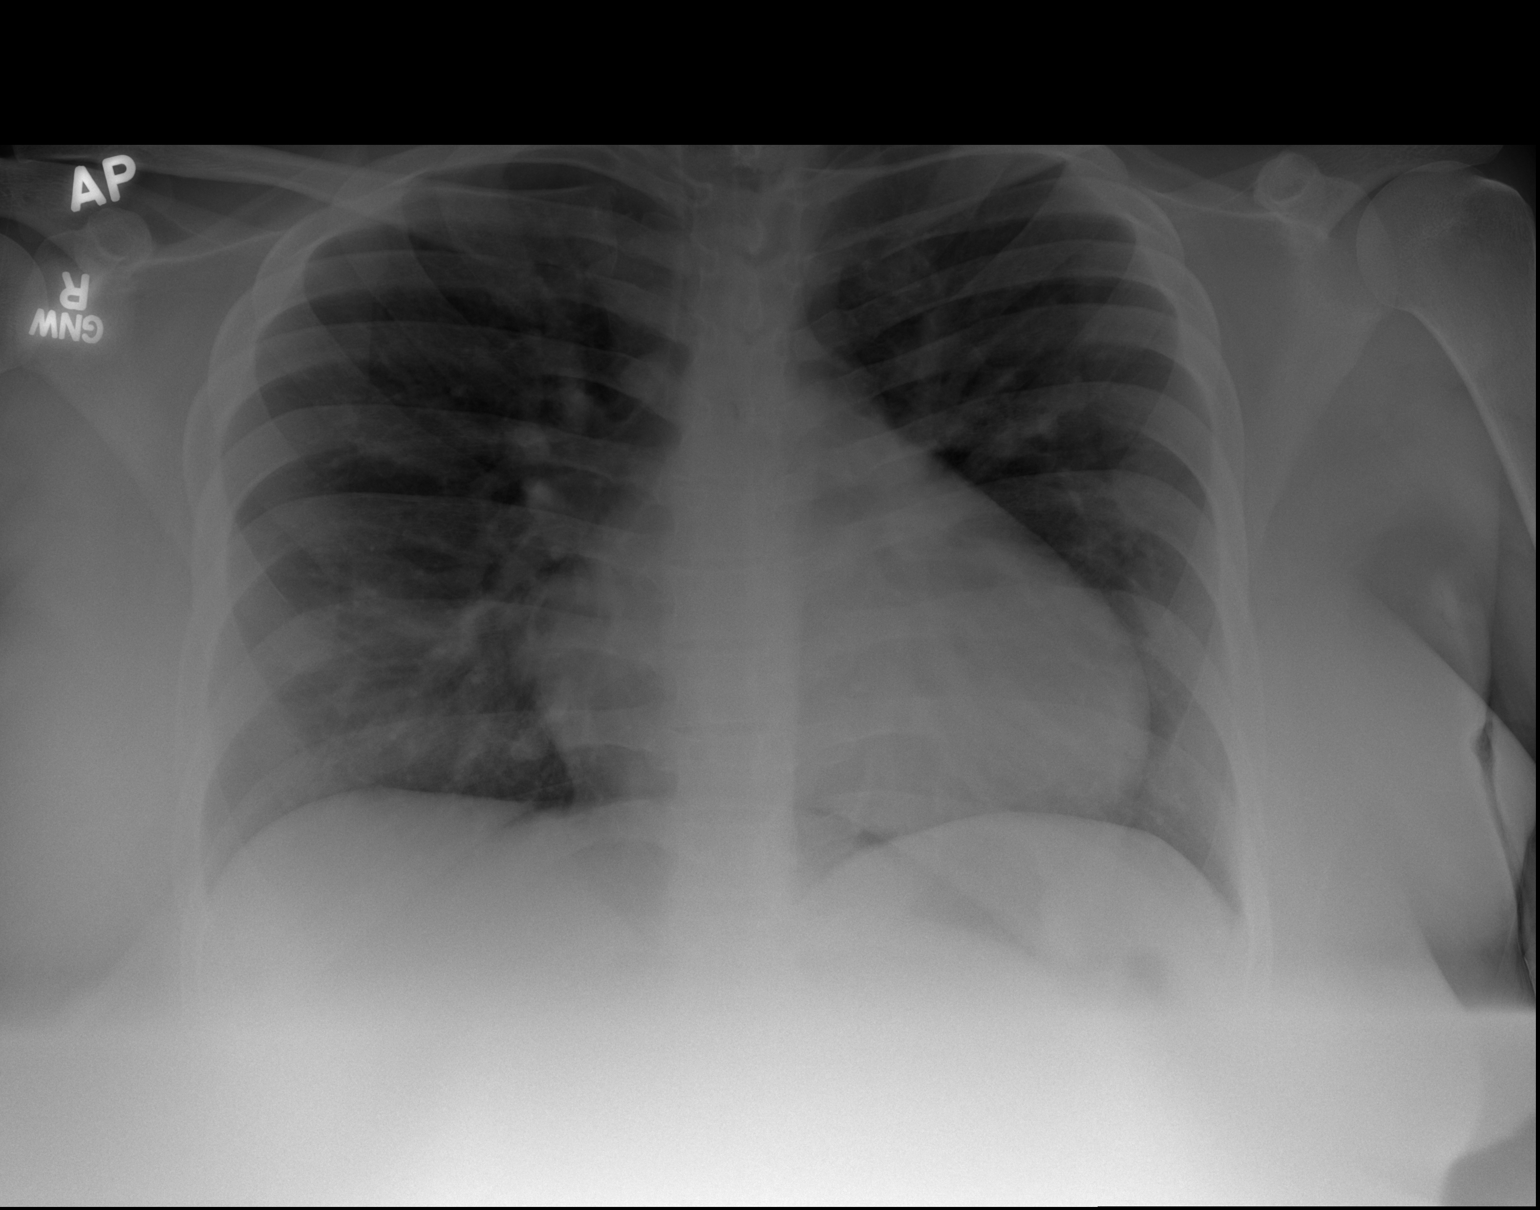

[1 of 1 positions shown; findings below may reference images not displayed]

FINDINGS: Single AP view was obtained.  The patient was shielded.
The heart size and mediastinal contours are normal.  Mild vascular
congestion is attributed to gravid state.  There is no edema,
confluent airspace opacity or significant pleural effusion.
IMPRESSION: No active cardiopulmonary process.

## 2013-08-16 ENCOUNTER — Encounter (INDEPENDENT_AMBULATORY_CARE_PROVIDER_SITE_OTHER): Payer: Self-pay | Admitting: Surgery

## 2013-08-16 ENCOUNTER — Ambulatory Visit (INDEPENDENT_AMBULATORY_CARE_PROVIDER_SITE_OTHER): Payer: Self-pay | Admitting: Surgery

## 2013-08-16 VITALS — BP 118/68 | HR 76 | Temp 98.2°F | Resp 14 | Ht 67.0 in | Wt 206.8 lb

## 2013-08-16 DIAGNOSIS — Z09 Encounter for follow-up examination after completed treatment for conditions other than malignant neoplasm: Secondary | ICD-10-CM

## 2013-08-16 NOTE — Progress Notes (Signed)
Subjective:     Patient ID: Morgan Walsh, female   DOB: 1990-10-13, 22 y.o.   MRN: [id number]  HPI She is here for first postop visit status post laparoscopic cholecystectomy. She is doing well and has no complaints.  Review of Systems     Objective:   Physical Exam On exam, her incisions are well healed. Her final pathology showed chronic cholecystitis with gallstones    Assessment:     Patient stable postop     Plan:     She may resume normal activity. I will see her back as needed

## 2014-06-20 ENCOUNTER — Encounter (INDEPENDENT_AMBULATORY_CARE_PROVIDER_SITE_OTHER): Payer: Self-pay | Admitting: Surgery

## 2014-07-18 ENCOUNTER — Emergency Department (HOSPITAL_COMMUNITY)
Admission: EM | Admit: 2014-07-18 | Discharge: 2014-07-18 | Discharge status: Against Medical Advice | Payer: Medicaid Other | Attending: Emergency Medicine | Admitting: Emergency Medicine

## 2014-07-18 ENCOUNTER — Encounter (HOSPITAL_COMMUNITY): Payer: Self-pay | Admitting: *Deleted

## 2014-07-18 DIAGNOSIS — R51 Headache: Secondary | ICD-10-CM | POA: Diagnosis present

## 2014-07-18 NOTE — ED Notes (Signed)
Pt in migraine since earlier today, history of same, states she was unable to control the pain today with OTC medication, also reports nausea

## 2016-09-07 ENCOUNTER — Ambulatory Visit (HOSPITAL_COMMUNITY)
Admission: EM | Admit: 2016-09-07 | Discharge: 2016-09-07 | Disposition: A | Discharge status: Home / Self Care | Payer: 59 | Attending: Family Medicine | Admitting: Family Medicine

## 2016-09-07 ENCOUNTER — Encounter (HOSPITAL_COMMUNITY): Payer: Self-pay | Admitting: Emergency Medicine

## 2016-09-07 DIAGNOSIS — J069 Acute upper respiratory infection, unspecified: Secondary | ICD-10-CM

## 2016-09-07 DIAGNOSIS — R059 Cough, unspecified: Secondary | ICD-10-CM

## 2016-09-07 DIAGNOSIS — R05 Cough: Secondary | ICD-10-CM

## 2016-09-07 MED ORDER — AZITHROMYCIN 250 MG PO TABS: 250.0000 mg | ORAL_TABLET | Freq: Every day | ORAL | 0 refills | Status: AC

## 2016-09-07 MED ORDER — HYDROCODONE-HOMATROPINE 5-1.5 MG/5ML PO SYRP: 5.0000 mL | ORAL_SOLUTION | Freq: Four times a day (QID) | ORAL | 0 refills | Status: AC | PRN

## 2016-09-07 NOTE — ED Provider Notes (Signed)
[facility]    CSN: [id number] Arrival date & time: 09/07/16  1843     History   Chief Complaint Chief Complaint  Patient presents with  . Cough    HPI Morgan Walsh is a 26 y.o. female.   This 26 year old woman who is currently unemployed and has one week of cough. She's had previous episodes of bronchitis and that which she thinks assist. She does not smoke nor does she have asthma. She hasn't been running a fever either.  Patient's had no GI symptoms such as nausea vomiting or diarrhea. She has no shortness of breath or chest pain. She has had some mild sore throat during this time.  Patient states that all of the sinus congestion, she's lost her sense of smell.      Past Medical History:  Diagnosis Date  . Acid reflux    occasional  . Postpartum state    delivered baby 07/04/2013  . Symptomatic cholelithiasis 07/2013    Patient Active Problem List   Diagnosis Date Noted  . Symptomatic cholelithiasis 04/12/2013    Past Surgical History:  Procedure Laterality Date  . CHOLECYSTECTOMY N/A 07/26/2013   Procedure: LAPAROSCOPIC CHOLECYSTECTOMY;  Surgeon: Shelly Rubenstein, MD;  Location: [facility];  Service: General;  Laterality: N/A;  . NO PAST SURGERIES      OB History    Gravida Para Term Preterm AB Living   1 1 1     1    SAB TAB Ectopic Multiple Live Births           1       Home Medications    Prior to Admission medications   Medication Sig Start Date End Date Taking? Authorizing Provider  azithromycin (ZITHROMAX) 250 MG tablet Take 1 tablet (250 mg total) by mouth daily. Take first 2 tablets together, then 1 every day until finished. 09/07/16   Elvina Sidle, MD  HYDROcodone-homatropine (HYDROMET) 5-1.5 MG/5ML syrup Take 5 mLs by mouth every 6 (six) hours as needed for cough. 09/07/16   Elvina Sidle, MD  ibuprofen (ADVIL,MOTRIN) 600 MG tablet Take 1 tablet (600 mg total) by mouth every 6 (six) hours. 07/06/13   Lavina Hamman, MD    Family History Family History  Problem Relation Age of Onset  . Hypertension Mother   . Stroke Maternal Grandmother   . Diabetes Maternal Grandmother     Social History Social History  Substance Use Topics  . Smoking status: Never Smoker  . Smokeless tobacco: Never Used  . Alcohol use Yes     Comment: occasionally     Allergies   Patient has no known allergies.   Review of Systems Review of Systems  Constitutional: Negative.   HENT: Positive for congestion and sore throat.   Respiratory: Positive for cough.   Cardiovascular: Negative for chest pain.  Gastrointestinal: Negative.   Genitourinary: Negative.   Neurological: Negative.      Physical Exam Triage Vital Signs ED Triage Vitals  Enc Vitals Group     BP 09/07/16 2018 100/70     Pulse Rate 09/07/16 2018 97     Resp 09/07/16 2018 24     Temp 09/07/16 2018 98.5 F (36.9 C)     Temp Source 09/07/16 2018 Oral     SpO2 09/07/16 2018 99 %     Weight --      Height --      Head Circumference --      Peak Flow --  Pain Score 09/07/16 2017 8     Pain Loc --      Pain Edu? --      Excl. in GC? --    No data found.   Updated Vital Signs BP 100/70 (BP Location: Left Arm) Comment (BP Location): large cuff  Pulse 97   Temp 98.5 F (36.9 C) (Oral)   Resp 24   SpO2 99%    Physical Exam  Constitutional: She is oriented to person, place, and time. She appears well-developed and well-nourished.  HENT:  Head: Normocephalic.  Right Ear: External ear normal.  Left Ear: External ear normal.  Mouth/Throat: Oropharynx is clear and moist.  Eyes: Conjunctivae and EOM are normal. Pupils are equal, round, and reactive to light.  Neck: Normal range of motion. Neck supple.  Cardiovascular: Normal rate, regular rhythm and normal heart sounds.   Pulmonary/Chest: Effort normal. She has wheezes.  Musculoskeletal: Normal range of motion.  Neurological: She is alert and oriented to person, place,  and time.  Skin: Skin is warm and dry.  Nursing note and vitals reviewed.    UC Treatments / Results  Labs (all labs ordered are listed, but only abnormal results are displayed) Labs Reviewed - No data to display  EKG  EKG Interpretation None       Radiology No results found.  Procedures Procedures (including critical care time)  Medications Ordered in UC Medications - No data to display   Initial Impression / Assessment and Plan / UC Course  I have reviewed the triage vital signs and the nursing notes.  Pertinent labs & imaging results that were available during my care of the patient were reviewed by me and considered in my medical decision making (see chart for details).     Final Clinical Impressions(s) / UC Diagnoses   Final diagnoses:  Cough  Upper respiratory tract infection, unspecified type    New Prescriptions New Prescriptions   AZITHROMYCIN (ZITHROMAX) 250 MG TABLET    Take 1 tablet (250 mg total) by mouth daily. Take first 2 tablets together, then 1 every day until finished.   HYDROCODONE-HOMATROPINE (HYDROMET) 5-1.5 MG/5ML SYRUP    Take 5 mLs by mouth every 6 (six) hours as needed for cough.     Elvina SidleKurt Alis Sawchuk, MD 09/07/16 2050

## 2016-09-07 NOTE — ED Triage Notes (Signed)
One week history of a cough, congestion, nasal drainage and sore throat

## 2016-09-13 DIAGNOSIS — R87610 Atypical squamous cells of undetermined significance on cytologic smear of cervix (ASC-US): Secondary | ICD-10-CM | POA: Diagnosis not present

## 2016-09-13 DIAGNOSIS — Z01419 Encounter for gynecological examination (general) (routine) without abnormal findings: Secondary | ICD-10-CM | POA: Diagnosis not present

## 2016-09-13 DIAGNOSIS — Z124 Encounter for screening for malignant neoplasm of cervix: Secondary | ICD-10-CM | POA: Diagnosis not present

## 2018-10-29 DIAGNOSIS — Z1389 Encounter for screening for other disorder: Secondary | ICD-10-CM | POA: Diagnosis not present

## 2018-10-29 DIAGNOSIS — Z01419 Encounter for gynecological examination (general) (routine) without abnormal findings: Secondary | ICD-10-CM | POA: Diagnosis not present

## 2018-10-29 DIAGNOSIS — Z130 Encounter for screening for diseases of the blood and blood-forming organs and certain disorders involving the immune mechanism: Secondary | ICD-10-CM | POA: Diagnosis not present
